# Patient Record
Sex: Female | Born: 1935 | Race: White | Hispanic: No | Smoking: Never smoker
Health system: Southern US, Community
[De-identification: ages and names within clinical notes are randomized; demographics above are authoritative.]

## PROBLEM LIST (undated history)

## (undated) DIAGNOSIS — E119 Type 2 diabetes mellitus without complications: Secondary | ICD-10-CM

## (undated) DIAGNOSIS — I10 Essential (primary) hypertension: Secondary | ICD-10-CM

## (undated) DIAGNOSIS — I509 Heart failure, unspecified: Secondary | ICD-10-CM

## (undated) DIAGNOSIS — I2510 Atherosclerotic heart disease of native coronary artery without angina pectoris: Secondary | ICD-10-CM

## (undated) DIAGNOSIS — E785 Hyperlipidemia, unspecified: Secondary | ICD-10-CM

## (undated) DIAGNOSIS — N189 Chronic kidney disease, unspecified: Secondary | ICD-10-CM

## (undated) HISTORY — DX: Essential (primary) hypertension: I10

## (undated) HISTORY — DX: Chronic kidney disease, unspecified: N18.9

## (undated) HISTORY — PX: TONSILLECTOMY: SUR1361

## (undated) HISTORY — PX: HYSTEROTOMY: SHX1776

## (undated) HISTORY — DX: Hyperlipidemia, unspecified: E78.5

## (undated) HISTORY — PX: APPENDECTOMY: SHX54

## (undated) HISTORY — PX: EYE SURGERY: SHX253

---
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
## 1997-08-08 ENCOUNTER — Other Ambulatory Visit: Admission: RE | Admit: 1997-08-08 | Discharge: 1997-08-08 | Payer: Self-pay | Admitting: Family Medicine

## 1997-08-12 ENCOUNTER — Other Ambulatory Visit: Admission: RE | Admit: 1997-08-12 | Discharge: 1997-08-12 | Payer: Self-pay | Admitting: Family Medicine

## 1997-08-29 ENCOUNTER — Ambulatory Visit (HOSPITAL_COMMUNITY): Admission: RE | Admit: 1997-08-29 | Discharge: 1997-08-29 | Payer: Self-pay | Admitting: Family Medicine

## 1998-02-25 ENCOUNTER — Ambulatory Visit (HOSPITAL_COMMUNITY): Admission: RE | Admit: 1998-02-25 | Discharge: 1998-02-25 | Payer: Self-pay | Admitting: Specialist

## 1998-08-26 ENCOUNTER — Other Ambulatory Visit: Admission: RE | Admit: 1998-08-26 | Discharge: 1998-08-26 | Payer: Self-pay | Admitting: Family Medicine

## 1998-09-12 ENCOUNTER — Ambulatory Visit (HOSPITAL_COMMUNITY): Admission: RE | Admit: 1998-09-12 | Discharge: 1998-09-12 | Payer: Self-pay | Admitting: Family Medicine

## 1999-08-27 ENCOUNTER — Other Ambulatory Visit: Admission: RE | Admit: 1999-08-27 | Discharge: 1999-08-27 | Payer: Self-pay | Admitting: Family Medicine

## 1999-10-02 ENCOUNTER — Ambulatory Visit (HOSPITAL_COMMUNITY): Admission: RE | Admit: 1999-10-02 | Discharge: 1999-10-02 | Payer: Self-pay | Admitting: Family Medicine

## 2000-09-09 ENCOUNTER — Other Ambulatory Visit: Admission: RE | Admit: 2000-09-09 | Discharge: 2000-09-09 | Payer: Self-pay | Admitting: Family Medicine

## 2000-10-11 ENCOUNTER — Encounter: Payer: Self-pay | Admitting: Family Medicine

## 2000-10-11 ENCOUNTER — Ambulatory Visit (HOSPITAL_COMMUNITY): Admission: RE | Admit: 2000-10-11 | Discharge: 2000-10-11 | Payer: Self-pay | Admitting: Family Medicine

## 2001-10-10 ENCOUNTER — Other Ambulatory Visit: Admission: RE | Admit: 2001-10-10 | Discharge: 2001-10-10 | Payer: Self-pay | Admitting: Family Medicine

## 2001-11-13 ENCOUNTER — Ambulatory Visit (HOSPITAL_COMMUNITY): Admission: RE | Admit: 2001-11-13 | Discharge: 2001-11-13 | Payer: Self-pay | Admitting: Family Medicine

## 2001-11-13 ENCOUNTER — Encounter: Payer: Self-pay | Admitting: Family Medicine

## 2002-11-22 ENCOUNTER — Encounter (INDEPENDENT_AMBULATORY_CARE_PROVIDER_SITE_OTHER): Payer: Self-pay | Admitting: Specialist

## 2002-11-22 ENCOUNTER — Ambulatory Visit (HOSPITAL_COMMUNITY): Admission: RE | Admit: 2002-11-22 | Discharge: 2002-11-22 | Payer: Self-pay | Admitting: Internal Medicine

## 2002-11-22 ENCOUNTER — Encounter: Payer: Self-pay | Admitting: Internal Medicine

## 2002-12-10 ENCOUNTER — Ambulatory Visit (HOSPITAL_COMMUNITY): Admission: RE | Admit: 2002-12-10 | Discharge: 2002-12-10 | Payer: Self-pay | Admitting: Obstetrics & Gynecology

## 2003-12-11 ENCOUNTER — Ambulatory Visit (HOSPITAL_COMMUNITY): Admission: RE | Admit: 2003-12-11 | Discharge: 2003-12-11 | Payer: Self-pay | Admitting: Family Medicine

## 2004-04-16 ENCOUNTER — Encounter: Admission: RE | Admit: 2004-04-16 | Discharge: 2004-04-16 | Payer: Self-pay | Admitting: Family Medicine

## 2004-12-04 ENCOUNTER — Encounter: Admission: RE | Admit: 2004-12-04 | Discharge: 2004-12-04 | Payer: Self-pay | Admitting: Family Medicine

## 2005-01-06 ENCOUNTER — Ambulatory Visit (HOSPITAL_COMMUNITY): Admission: RE | Admit: 2005-01-06 | Discharge: 2005-01-06 | Payer: Self-pay | Admitting: Family Medicine

## 2006-01-07 ENCOUNTER — Ambulatory Visit (HOSPITAL_COMMUNITY): Admission: RE | Admit: 2006-01-07 | Discharge: 2006-01-07 | Payer: Self-pay | Admitting: Family Medicine

## 2007-02-14 ENCOUNTER — Ambulatory Visit (HOSPITAL_COMMUNITY): Admission: RE | Admit: 2007-02-14 | Discharge: 2007-02-14 | Payer: Self-pay | Admitting: Family Medicine

## 2008-02-16 ENCOUNTER — Ambulatory Visit (HOSPITAL_COMMUNITY): Admission: RE | Admit: 2008-02-16 | Discharge: 2008-02-16 | Payer: Self-pay | Admitting: Family Medicine

## 2009-03-12 ENCOUNTER — Ambulatory Visit (HOSPITAL_COMMUNITY): Admission: RE | Admit: 2009-03-12 | Discharge: 2009-03-12 | Payer: Self-pay | Admitting: Internal Medicine

## 2009-03-28 ENCOUNTER — Telehealth: Payer: Self-pay | Admitting: Internal Medicine

## 2010-06-11 NOTE — Procedures (Signed)
Summary: COLONOSCOPY    Patient Name: Tracy Donaldson, Tracy Donaldson MRN: [id number] Procedure Procedures: Colonoscopy CPT: H7044205.    with biopsy. CPT: L3157292.  Personnel: Endoscopist: Gatha Mayer, MD, [facility].  Referred By: Dorene Grebe Juventino Slovak, MD.  Exam Location: Exam performed in Endoscopy Suite. Outpatient  Patient Consent: Procedure, Alternatives, Risks and Benefits discussed, consent obtained,  Indications  Evaluation of: Anemia  Symptoms: Diarrhea  Average Risk Screening Routine.  History  Pre-Exam Physical: Performed Nov 22, 2002. Cardio-pulmonary exam, Rectal exam, HEENT exam , Abdominal exam, Mental status exam WNL.  Exam Exam: Extent of exam reached: Cecum, extent intended: Cecum.  The cecum was identified by appendiceal orifice and IC valve. Patient position: left side to back. Colon retroflexion performed. Images taken. ASA Classification: II. Tolerance: good.  Monitoring: Pulse and BP monitoring, Oximetry used. Supplemental O2 given.  Colon Prep Used MiraLax for colon prep. Prep results: excellent.  Fluoroscopy: Fluoroscopy was not used.  Sedation Meds: Patient assessed and found to be appropriate for moderate (conscious) sedation. Sedation was managed by the Endoscopist. Fentanyl 100 mcg. given IV. Versed 7 mg. given IV.  Findings - NORMAL EXAM: Cecum to Sigmoid Colon. Biopsy/Normal Exam taken.  HEMORRHOIDS: External. Size: Grade I. Not bleeding. Not thrombosed. ICD9: Hemorrhoids, External: 455.3.   Assessment Abnormal examination, see findings above.  Diagnoses: 455.3: Hemorrhoids, External.   Comments: MILD HEMORRHOIDS, OTHERWISE NORMAL. RANDOM BIOPSIES TAKEN TO EVALUATE DIARRHEA Events  Unplanned Interventions: No intervention was required.  Plans Medication Plan: Await pathology.  Comments: IMODIUM AS NEEDED FOR NOW Disposition: After procedure patient sent to recovery. After recovery patient sent home.  Comments: I WILL CALL RESULTS AND PLANS. IF  BIOPSIES OK WILL ATTRIBUTE DIARRHEA TO GLUCOPHAGE.   CC: James B.Kindle,MD        The Patient   This report was created from the original endoscopy report, which was reviewed and signed by the above listed endoscopist.

## 2010-06-11 NOTE — Progress Notes (Signed)
Summary: ? COLON RECALL  Phone Note Call from Patient Call back at Home Phone [phone]   Caller: Patient Call For: DR.GESSNER Reason for Call: Talk to Nurse Summary of Call: pt getting established with a new PCP and he would like to know when pt is due for next COL accordingto Dr. Carlean Purl... nothing in IDX... pt's last COL was 2004... pt not reporting any GI concerns at this time. Initial call taken by: Lucien Mons,  March 28, 2009 10:31 AM  Follow-up for Phone Call        DR.GESSNER PLEASE ADVISE.  Follow-up by: Vivia Ewing LPN,  November 19, 624THL 10:42 AM  Additional Follow-up for Phone Call Additional follow up Details #1::        She could ask about having one 11/2012 but would be greater than 80 and I would not put in routine recall. If having GI problems in future like bleeding, anemia then could need diagnostic eval.  Additional Follow-up by: Gatha Mayer MD, Marval Regal,  March 28, 2009 11:26 AM    Additional Follow-up for Phone Call Additional follow up Details #2::    Above MD orders reviewed with patient. Pt. instructed to call back as needed.  Follow-up by: Vivia Ewing LPN,  November 19, 624THL 11:56 AM

## 2010-10-20 ENCOUNTER — Other Ambulatory Visit: Payer: Self-pay | Admitting: Dermatology

## 2011-02-12 ENCOUNTER — Other Ambulatory Visit (HOSPITAL_COMMUNITY): Payer: Self-pay | Admitting: Internal Medicine

## 2011-02-12 DIAGNOSIS — Z1231 Encounter for screening mammogram for malignant neoplasm of breast: Secondary | ICD-10-CM

## 2011-02-19 ENCOUNTER — Ambulatory Visit (HOSPITAL_COMMUNITY)
Admission: RE | Admit: 2011-02-19 | Discharge: 2011-02-19 | Disposition: A | Discharge status: Home / Self Care | Payer: Medicare Other | Source: Ambulatory Visit | Attending: Internal Medicine | Admitting: Internal Medicine

## 2011-02-19 DIAGNOSIS — Z1231 Encounter for screening mammogram for malignant neoplasm of breast: Secondary | ICD-10-CM | POA: Insufficient documentation

## 2011-05-31 DIAGNOSIS — H9210 Otorrhea, unspecified ear: Secondary | ICD-10-CM | POA: Diagnosis not present

## 2011-06-03 DIAGNOSIS — E1129 Type 2 diabetes mellitus with other diabetic kidney complication: Secondary | ICD-10-CM | POA: Diagnosis not present

## 2011-06-03 DIAGNOSIS — E559 Vitamin D deficiency, unspecified: Secondary | ICD-10-CM | POA: Diagnosis not present

## 2011-06-03 DIAGNOSIS — I10 Essential (primary) hypertension: Secondary | ICD-10-CM | POA: Diagnosis not present

## 2011-06-10 DIAGNOSIS — N184 Chronic kidney disease, stage 4 (severe): Secondary | ICD-10-CM | POA: Diagnosis not present

## 2011-06-10 DIAGNOSIS — I10 Essential (primary) hypertension: Secondary | ICD-10-CM | POA: Diagnosis not present

## 2011-06-10 DIAGNOSIS — E1165 Type 2 diabetes mellitus with hyperglycemia: Secondary | ICD-10-CM | POA: Diagnosis not present

## 2011-06-10 DIAGNOSIS — E1129 Type 2 diabetes mellitus with other diabetic kidney complication: Secondary | ICD-10-CM | POA: Diagnosis not present

## 2011-06-10 DIAGNOSIS — E785 Hyperlipidemia, unspecified: Secondary | ICD-10-CM | POA: Diagnosis not present

## 2011-06-15 DIAGNOSIS — H9210 Otorrhea, unspecified ear: Secondary | ICD-10-CM | POA: Diagnosis not present

## 2011-06-15 DIAGNOSIS — L299 Pruritus, unspecified: Secondary | ICD-10-CM | POA: Diagnosis not present

## 2011-06-16 DIAGNOSIS — E1139 Type 2 diabetes mellitus with other diabetic ophthalmic complication: Secondary | ICD-10-CM | POA: Diagnosis not present

## 2011-06-16 DIAGNOSIS — E11349 Type 2 diabetes mellitus with severe nonproliferative diabetic retinopathy without macular edema: Secondary | ICD-10-CM | POA: Diagnosis not present

## 2011-06-16 DIAGNOSIS — H35379 Puckering of macula, unspecified eye: Secondary | ICD-10-CM | POA: Diagnosis not present

## 2011-06-16 DIAGNOSIS — H35359 Cystoid macular degeneration, unspecified eye: Secondary | ICD-10-CM | POA: Diagnosis not present

## 2011-08-10 DIAGNOSIS — Z961 Presence of intraocular lens: Secondary | ICD-10-CM | POA: Diagnosis not present

## 2011-08-10 DIAGNOSIS — H524 Presbyopia: Secondary | ICD-10-CM | POA: Diagnosis not present

## 2011-08-10 DIAGNOSIS — E1139 Type 2 diabetes mellitus with other diabetic ophthalmic complication: Secondary | ICD-10-CM | POA: Diagnosis not present

## 2011-08-10 DIAGNOSIS — H5200 Hypermetropia, unspecified eye: Secondary | ICD-10-CM | POA: Diagnosis not present

## 2011-08-30 DIAGNOSIS — E1129 Type 2 diabetes mellitus with other diabetic kidney complication: Secondary | ICD-10-CM | POA: Diagnosis not present

## 2011-08-30 DIAGNOSIS — E11319 Type 2 diabetes mellitus with unspecified diabetic retinopathy without macular edema: Secondary | ICD-10-CM | POA: Diagnosis not present

## 2011-08-30 DIAGNOSIS — E1165 Type 2 diabetes mellitus with hyperglycemia: Secondary | ICD-10-CM | POA: Diagnosis not present

## 2011-09-06 DIAGNOSIS — N184 Chronic kidney disease, stage 4 (severe): Secondary | ICD-10-CM | POA: Diagnosis not present

## 2011-09-06 DIAGNOSIS — E785 Hyperlipidemia, unspecified: Secondary | ICD-10-CM | POA: Diagnosis not present

## 2011-09-06 DIAGNOSIS — I10 Essential (primary) hypertension: Secondary | ICD-10-CM | POA: Diagnosis not present

## 2011-09-06 DIAGNOSIS — E1129 Type 2 diabetes mellitus with other diabetic kidney complication: Secondary | ICD-10-CM | POA: Diagnosis not present

## 2011-10-26 DIAGNOSIS — E11349 Type 2 diabetes mellitus with severe nonproliferative diabetic retinopathy without macular edema: Secondary | ICD-10-CM | POA: Diagnosis not present

## 2011-10-26 DIAGNOSIS — E1139 Type 2 diabetes mellitus with other diabetic ophthalmic complication: Secondary | ICD-10-CM | POA: Diagnosis not present

## 2011-10-26 DIAGNOSIS — E11359 Type 2 diabetes mellitus with proliferative diabetic retinopathy without macular edema: Secondary | ICD-10-CM | POA: Diagnosis not present

## 2011-10-26 DIAGNOSIS — H35359 Cystoid macular degeneration, unspecified eye: Secondary | ICD-10-CM | POA: Diagnosis not present

## 2011-11-03 DIAGNOSIS — E1139 Type 2 diabetes mellitus with other diabetic ophthalmic complication: Secondary | ICD-10-CM | POA: Diagnosis not present

## 2011-11-03 DIAGNOSIS — E11359 Type 2 diabetes mellitus with proliferative diabetic retinopathy without macular edema: Secondary | ICD-10-CM | POA: Diagnosis not present

## 2011-12-08 DIAGNOSIS — E785 Hyperlipidemia, unspecified: Secondary | ICD-10-CM | POA: Diagnosis not present

## 2011-12-08 DIAGNOSIS — E1129 Type 2 diabetes mellitus with other diabetic kidney complication: Secondary | ICD-10-CM | POA: Diagnosis not present

## 2011-12-08 DIAGNOSIS — I10 Essential (primary) hypertension: Secondary | ICD-10-CM | POA: Diagnosis not present

## 2011-12-08 DIAGNOSIS — E559 Vitamin D deficiency, unspecified: Secondary | ICD-10-CM | POA: Diagnosis not present

## 2011-12-08 DIAGNOSIS — Z0000 Encounter for general adult medical examination without abnormal findings: Secondary | ICD-10-CM | POA: Diagnosis not present

## 2011-12-20 DIAGNOSIS — I10 Essential (primary) hypertension: Secondary | ICD-10-CM | POA: Diagnosis not present

## 2011-12-20 DIAGNOSIS — E785 Hyperlipidemia, unspecified: Secondary | ICD-10-CM | POA: Diagnosis not present

## 2011-12-20 DIAGNOSIS — E1129 Type 2 diabetes mellitus with other diabetic kidney complication: Secondary | ICD-10-CM | POA: Diagnosis not present

## 2011-12-20 DIAGNOSIS — N184 Chronic kidney disease, stage 4 (severe): Secondary | ICD-10-CM | POA: Diagnosis not present

## 2012-02-11 ENCOUNTER — Other Ambulatory Visit: Payer: Self-pay | Admitting: Dermatology

## 2012-02-11 DIAGNOSIS — Z85828 Personal history of other malignant neoplasm of skin: Secondary | ICD-10-CM | POA: Diagnosis not present

## 2012-02-11 DIAGNOSIS — L821 Other seborrheic keratosis: Secondary | ICD-10-CM | POA: Diagnosis not present

## 2012-02-11 DIAGNOSIS — I789 Disease of capillaries, unspecified: Secondary | ICD-10-CM | POA: Diagnosis not present

## 2012-02-11 DIAGNOSIS — D485 Neoplasm of uncertain behavior of skin: Secondary | ICD-10-CM | POA: Diagnosis not present

## 2012-02-11 DIAGNOSIS — D2370 Other benign neoplasm of skin of unspecified lower limb, including hip: Secondary | ICD-10-CM | POA: Diagnosis not present

## 2012-03-21 DIAGNOSIS — N184 Chronic kidney disease, stage 4 (severe): Secondary | ICD-10-CM | POA: Diagnosis not present

## 2012-03-21 DIAGNOSIS — E1129 Type 2 diabetes mellitus with other diabetic kidney complication: Secondary | ICD-10-CM | POA: Diagnosis not present

## 2012-03-21 DIAGNOSIS — Z23 Encounter for immunization: Secondary | ICD-10-CM | POA: Diagnosis not present

## 2012-03-21 DIAGNOSIS — I10 Essential (primary) hypertension: Secondary | ICD-10-CM | POA: Diagnosis not present

## 2012-03-21 DIAGNOSIS — E785 Hyperlipidemia, unspecified: Secondary | ICD-10-CM | POA: Diagnosis not present

## 2012-03-27 DIAGNOSIS — H35359 Cystoid macular degeneration, unspecified eye: Secondary | ICD-10-CM | POA: Diagnosis not present

## 2012-03-27 DIAGNOSIS — E11359 Type 2 diabetes mellitus with proliferative diabetic retinopathy without macular edema: Secondary | ICD-10-CM | POA: Diagnosis not present

## 2012-03-27 DIAGNOSIS — E1139 Type 2 diabetes mellitus with other diabetic ophthalmic complication: Secondary | ICD-10-CM | POA: Diagnosis not present

## 2012-03-27 DIAGNOSIS — E11349 Type 2 diabetes mellitus with severe nonproliferative diabetic retinopathy without macular edema: Secondary | ICD-10-CM | POA: Diagnosis not present

## 2012-06-21 DIAGNOSIS — E1129 Type 2 diabetes mellitus with other diabetic kidney complication: Secondary | ICD-10-CM | POA: Diagnosis not present

## 2012-06-21 DIAGNOSIS — E559 Vitamin D deficiency, unspecified: Secondary | ICD-10-CM | POA: Diagnosis not present

## 2012-06-21 DIAGNOSIS — E785 Hyperlipidemia, unspecified: Secondary | ICD-10-CM | POA: Diagnosis not present

## 2012-06-21 DIAGNOSIS — I10 Essential (primary) hypertension: Secondary | ICD-10-CM | POA: Diagnosis not present

## 2012-06-28 DIAGNOSIS — E1129 Type 2 diabetes mellitus with other diabetic kidney complication: Secondary | ICD-10-CM | POA: Diagnosis not present

## 2012-06-28 DIAGNOSIS — E785 Hyperlipidemia, unspecified: Secondary | ICD-10-CM | POA: Diagnosis not present

## 2012-06-28 DIAGNOSIS — N184 Chronic kidney disease, stage 4 (severe): Secondary | ICD-10-CM | POA: Diagnosis not present

## 2012-06-28 DIAGNOSIS — I10 Essential (primary) hypertension: Secondary | ICD-10-CM | POA: Diagnosis not present

## 2012-08-10 DIAGNOSIS — E11311 Type 2 diabetes mellitus with unspecified diabetic retinopathy with macular edema: Secondary | ICD-10-CM | POA: Diagnosis not present

## 2012-08-10 DIAGNOSIS — E11349 Type 2 diabetes mellitus with severe nonproliferative diabetic retinopathy without macular edema: Secondary | ICD-10-CM | POA: Diagnosis not present

## 2012-08-10 DIAGNOSIS — E1139 Type 2 diabetes mellitus with other diabetic ophthalmic complication: Secondary | ICD-10-CM | POA: Diagnosis not present

## 2012-09-26 DIAGNOSIS — H5200 Hypermetropia, unspecified eye: Secondary | ICD-10-CM | POA: Diagnosis not present

## 2012-09-26 DIAGNOSIS — E1139 Type 2 diabetes mellitus with other diabetic ophthalmic complication: Secondary | ICD-10-CM | POA: Diagnosis not present

## 2012-09-26 DIAGNOSIS — H01009 Unspecified blepharitis unspecified eye, unspecified eyelid: Secondary | ICD-10-CM | POA: Diagnosis not present

## 2012-09-26 DIAGNOSIS — H3500 Unspecified background retinopathy: Secondary | ICD-10-CM | POA: Diagnosis not present

## 2012-09-27 DIAGNOSIS — E1129 Type 2 diabetes mellitus with other diabetic kidney complication: Secondary | ICD-10-CM | POA: Diagnosis not present

## 2012-09-27 DIAGNOSIS — E1165 Type 2 diabetes mellitus with hyperglycemia: Secondary | ICD-10-CM | POA: Diagnosis not present

## 2012-10-03 DIAGNOSIS — I10 Essential (primary) hypertension: Secondary | ICD-10-CM | POA: Diagnosis not present

## 2012-10-03 DIAGNOSIS — E785 Hyperlipidemia, unspecified: Secondary | ICD-10-CM | POA: Diagnosis not present

## 2012-10-03 DIAGNOSIS — E1129 Type 2 diabetes mellitus with other diabetic kidney complication: Secondary | ICD-10-CM | POA: Diagnosis not present

## 2012-10-03 DIAGNOSIS — N184 Chronic kidney disease, stage 4 (severe): Secondary | ICD-10-CM | POA: Diagnosis not present

## 2012-12-26 DIAGNOSIS — E1139 Type 2 diabetes mellitus with other diabetic ophthalmic complication: Secondary | ICD-10-CM | POA: Diagnosis not present

## 2012-12-26 DIAGNOSIS — E11349 Type 2 diabetes mellitus with severe nonproliferative diabetic retinopathy without macular edema: Secondary | ICD-10-CM | POA: Diagnosis not present

## 2012-12-28 DIAGNOSIS — Z1331 Encounter for screening for depression: Secondary | ICD-10-CM | POA: Diagnosis not present

## 2012-12-28 DIAGNOSIS — Z0000 Encounter for general adult medical examination without abnormal findings: Secondary | ICD-10-CM | POA: Diagnosis not present

## 2012-12-28 DIAGNOSIS — I10 Essential (primary) hypertension: Secondary | ICD-10-CM | POA: Diagnosis not present

## 2012-12-28 DIAGNOSIS — E559 Vitamin D deficiency, unspecified: Secondary | ICD-10-CM | POA: Diagnosis not present

## 2012-12-28 DIAGNOSIS — E1129 Type 2 diabetes mellitus with other diabetic kidney complication: Secondary | ICD-10-CM | POA: Diagnosis not present

## 2013-01-01 ENCOUNTER — Other Ambulatory Visit (HOSPITAL_COMMUNITY): Payer: Self-pay | Admitting: Internal Medicine

## 2013-01-01 DIAGNOSIS — Z1231 Encounter for screening mammogram for malignant neoplasm of breast: Secondary | ICD-10-CM

## 2013-01-04 DIAGNOSIS — N184 Chronic kidney disease, stage 4 (severe): Secondary | ICD-10-CM | POA: Diagnosis not present

## 2013-01-04 DIAGNOSIS — J309 Allergic rhinitis, unspecified: Secondary | ICD-10-CM | POA: Diagnosis not present

## 2013-01-04 DIAGNOSIS — I10 Essential (primary) hypertension: Secondary | ICD-10-CM | POA: Diagnosis not present

## 2013-01-04 DIAGNOSIS — E785 Hyperlipidemia, unspecified: Secondary | ICD-10-CM | POA: Diagnosis not present

## 2013-01-04 DIAGNOSIS — E1129 Type 2 diabetes mellitus with other diabetic kidney complication: Secondary | ICD-10-CM | POA: Diagnosis not present

## 2013-01-15 ENCOUNTER — Ambulatory Visit (HOSPITAL_COMMUNITY)
Admission: RE | Admit: 2013-01-15 | Discharge: 2013-01-15 | Disposition: A | Discharge status: Home / Self Care | Payer: Medicare Other | Source: Ambulatory Visit | Attending: Internal Medicine | Admitting: Internal Medicine

## 2013-01-15 DIAGNOSIS — Z1231 Encounter for screening mammogram for malignant neoplasm of breast: Secondary | ICD-10-CM | POA: Insufficient documentation

## 2013-02-07 DIAGNOSIS — T169XXA Foreign body in ear, unspecified ear, initial encounter: Secondary | ICD-10-CM | POA: Diagnosis not present

## 2013-02-09 DIAGNOSIS — H93299 Other abnormal auditory perceptions, unspecified ear: Secondary | ICD-10-CM | POA: Diagnosis not present

## 2013-02-20 DIAGNOSIS — H66009 Acute suppurative otitis media without spontaneous rupture of ear drum, unspecified ear: Secondary | ICD-10-CM | POA: Diagnosis not present

## 2013-02-27 DIAGNOSIS — H908 Mixed conductive and sensorineural hearing loss, unspecified: Secondary | ICD-10-CM | POA: Diagnosis not present

## 2013-02-27 DIAGNOSIS — H902 Conductive hearing loss, unspecified: Secondary | ICD-10-CM | POA: Diagnosis not present

## 2013-02-27 DIAGNOSIS — H7290 Unspecified perforation of tympanic membrane, unspecified ear: Secondary | ICD-10-CM | POA: Diagnosis not present

## 2013-02-27 DIAGNOSIS — H905 Unspecified sensorineural hearing loss: Secondary | ICD-10-CM | POA: Diagnosis not present

## 2013-02-28 DIAGNOSIS — Z23 Encounter for immunization: Secondary | ICD-10-CM | POA: Diagnosis not present

## 2013-03-26 DIAGNOSIS — I10 Essential (primary) hypertension: Secondary | ICD-10-CM | POA: Diagnosis not present

## 2013-03-26 DIAGNOSIS — E1129 Type 2 diabetes mellitus with other diabetic kidney complication: Secondary | ICD-10-CM | POA: Diagnosis not present

## 2013-04-02 DIAGNOSIS — E1129 Type 2 diabetes mellitus with other diabetic kidney complication: Secondary | ICD-10-CM | POA: Diagnosis not present

## 2013-04-02 DIAGNOSIS — I10 Essential (primary) hypertension: Secondary | ICD-10-CM | POA: Diagnosis not present

## 2013-04-02 DIAGNOSIS — E785 Hyperlipidemia, unspecified: Secondary | ICD-10-CM | POA: Diagnosis not present

## 2013-04-02 DIAGNOSIS — N184 Chronic kidney disease, stage 4 (severe): Secondary | ICD-10-CM | POA: Diagnosis not present

## 2013-04-03 DIAGNOSIS — H7290 Unspecified perforation of tympanic membrane, unspecified ear: Secondary | ICD-10-CM | POA: Diagnosis not present

## 2013-04-24 DIAGNOSIS — H35379 Puckering of macula, unspecified eye: Secondary | ICD-10-CM | POA: Diagnosis not present

## 2013-04-24 DIAGNOSIS — E11349 Type 2 diabetes mellitus with severe nonproliferative diabetic retinopathy without macular edema: Secondary | ICD-10-CM | POA: Diagnosis not present

## 2013-04-24 DIAGNOSIS — E11359 Type 2 diabetes mellitus with proliferative diabetic retinopathy without macular edema: Secondary | ICD-10-CM | POA: Diagnosis not present

## 2013-04-24 DIAGNOSIS — E1139 Type 2 diabetes mellitus with other diabetic ophthalmic complication: Secondary | ICD-10-CM | POA: Diagnosis not present

## 2013-04-24 DIAGNOSIS — H35369 Drusen (degenerative) of macula, unspecified eye: Secondary | ICD-10-CM | POA: Diagnosis not present

## 2013-07-10 DIAGNOSIS — E1129 Type 2 diabetes mellitus with other diabetic kidney complication: Secondary | ICD-10-CM | POA: Diagnosis not present

## 2013-07-10 DIAGNOSIS — E559 Vitamin D deficiency, unspecified: Secondary | ICD-10-CM | POA: Diagnosis not present

## 2013-07-10 DIAGNOSIS — I10 Essential (primary) hypertension: Secondary | ICD-10-CM | POA: Diagnosis not present

## 2013-07-19 DIAGNOSIS — E785 Hyperlipidemia, unspecified: Secondary | ICD-10-CM | POA: Diagnosis not present

## 2013-07-19 DIAGNOSIS — I10 Essential (primary) hypertension: Secondary | ICD-10-CM | POA: Diagnosis not present

## 2013-07-19 DIAGNOSIS — N184 Chronic kidney disease, stage 4 (severe): Secondary | ICD-10-CM | POA: Diagnosis not present

## 2013-07-19 DIAGNOSIS — E1129 Type 2 diabetes mellitus with other diabetic kidney complication: Secondary | ICD-10-CM | POA: Diagnosis not present

## 2013-07-20 ENCOUNTER — Other Ambulatory Visit: Payer: Self-pay | Admitting: Dermatology

## 2013-07-20 DIAGNOSIS — Z85828 Personal history of other malignant neoplasm of skin: Secondary | ICD-10-CM | POA: Diagnosis not present

## 2013-07-20 DIAGNOSIS — L821 Other seborrheic keratosis: Secondary | ICD-10-CM | POA: Diagnosis not present

## 2013-07-20 DIAGNOSIS — D485 Neoplasm of uncertain behavior of skin: Secondary | ICD-10-CM | POA: Diagnosis not present

## 2013-07-20 DIAGNOSIS — D0470 Carcinoma in situ of skin of unspecified lower limb, including hip: Secondary | ICD-10-CM | POA: Diagnosis not present

## 2013-07-20 DIAGNOSIS — C4370 Malignant melanoma of unspecified lower limb, including hip: Secondary | ICD-10-CM | POA: Diagnosis not present

## 2013-07-20 DIAGNOSIS — D1801 Hemangioma of skin and subcutaneous tissue: Secondary | ICD-10-CM | POA: Diagnosis not present

## 2013-07-31 ENCOUNTER — Other Ambulatory Visit: Payer: Self-pay | Admitting: Dermatology

## 2013-07-31 DIAGNOSIS — C4370 Malignant melanoma of unspecified lower limb, including hip: Secondary | ICD-10-CM | POA: Diagnosis not present

## 2013-07-31 DIAGNOSIS — Z85828 Personal history of other malignant neoplasm of skin: Secondary | ICD-10-CM | POA: Diagnosis not present

## 2013-07-31 DIAGNOSIS — D485 Neoplasm of uncertain behavior of skin: Secondary | ICD-10-CM | POA: Diagnosis not present

## 2013-10-02 DIAGNOSIS — Z961 Presence of intraocular lens: Secondary | ICD-10-CM | POA: Diagnosis not present

## 2013-10-02 DIAGNOSIS — E1139 Type 2 diabetes mellitus with other diabetic ophthalmic complication: Secondary | ICD-10-CM | POA: Diagnosis not present

## 2013-10-02 DIAGNOSIS — H52209 Unspecified astigmatism, unspecified eye: Secondary | ICD-10-CM | POA: Diagnosis not present

## 2013-10-10 DIAGNOSIS — E1129 Type 2 diabetes mellitus with other diabetic kidney complication: Secondary | ICD-10-CM | POA: Diagnosis not present

## 2013-10-10 DIAGNOSIS — E785 Hyperlipidemia, unspecified: Secondary | ICD-10-CM | POA: Diagnosis not present

## 2013-10-18 DIAGNOSIS — N184 Chronic kidney disease, stage 4 (severe): Secondary | ICD-10-CM | POA: Diagnosis not present

## 2013-10-18 DIAGNOSIS — I10 Essential (primary) hypertension: Secondary | ICD-10-CM | POA: Diagnosis not present

## 2013-10-18 DIAGNOSIS — E785 Hyperlipidemia, unspecified: Secondary | ICD-10-CM | POA: Diagnosis not present

## 2013-10-18 DIAGNOSIS — E1129 Type 2 diabetes mellitus with other diabetic kidney complication: Secondary | ICD-10-CM | POA: Diagnosis not present

## 2013-10-23 DIAGNOSIS — E11359 Type 2 diabetes mellitus with proliferative diabetic retinopathy without macular edema: Secondary | ICD-10-CM | POA: Diagnosis not present

## 2013-10-23 DIAGNOSIS — E1139 Type 2 diabetes mellitus with other diabetic ophthalmic complication: Secondary | ICD-10-CM | POA: Diagnosis not present

## 2013-10-23 DIAGNOSIS — H35359 Cystoid macular degeneration, unspecified eye: Secondary | ICD-10-CM | POA: Diagnosis not present

## 2013-10-23 DIAGNOSIS — E11349 Type 2 diabetes mellitus with severe nonproliferative diabetic retinopathy without macular edema: Secondary | ICD-10-CM | POA: Diagnosis not present

## 2014-01-10 DIAGNOSIS — E559 Vitamin D deficiency, unspecified: Secondary | ICD-10-CM | POA: Diagnosis not present

## 2014-01-10 DIAGNOSIS — E1129 Type 2 diabetes mellitus with other diabetic kidney complication: Secondary | ICD-10-CM | POA: Diagnosis not present

## 2014-01-10 DIAGNOSIS — I10 Essential (primary) hypertension: Secondary | ICD-10-CM | POA: Diagnosis not present

## 2014-01-16 DIAGNOSIS — E1165 Type 2 diabetes mellitus with hyperglycemia: Secondary | ICD-10-CM | POA: Diagnosis not present

## 2014-01-16 DIAGNOSIS — E1129 Type 2 diabetes mellitus with other diabetic kidney complication: Secondary | ICD-10-CM | POA: Diagnosis not present

## 2014-01-16 DIAGNOSIS — E785 Hyperlipidemia, unspecified: Secondary | ICD-10-CM | POA: Diagnosis not present

## 2014-01-16 DIAGNOSIS — N184 Chronic kidney disease, stage 4 (severe): Secondary | ICD-10-CM | POA: Diagnosis not present

## 2014-01-16 DIAGNOSIS — I10 Essential (primary) hypertension: Secondary | ICD-10-CM | POA: Diagnosis not present

## 2014-01-16 DIAGNOSIS — E559 Vitamin D deficiency, unspecified: Secondary | ICD-10-CM | POA: Diagnosis not present

## 2014-02-07 DIAGNOSIS — L812 Freckles: Secondary | ICD-10-CM | POA: Diagnosis not present

## 2014-02-07 DIAGNOSIS — L821 Other seborrheic keratosis: Secondary | ICD-10-CM | POA: Diagnosis not present

## 2014-02-07 DIAGNOSIS — Z85828 Personal history of other malignant neoplasm of skin: Secondary | ICD-10-CM | POA: Diagnosis not present

## 2014-02-07 DIAGNOSIS — Z85820 Personal history of malignant melanoma of skin: Secondary | ICD-10-CM | POA: Diagnosis not present

## 2014-02-07 DIAGNOSIS — D1801 Hemangioma of skin and subcutaneous tissue: Secondary | ICD-10-CM | POA: Diagnosis not present

## 2014-02-19 DIAGNOSIS — Z23 Encounter for immunization: Secondary | ICD-10-CM | POA: Diagnosis not present

## 2014-04-11 DIAGNOSIS — E559 Vitamin D deficiency, unspecified: Secondary | ICD-10-CM | POA: Diagnosis not present

## 2014-04-11 DIAGNOSIS — I10 Essential (primary) hypertension: Secondary | ICD-10-CM | POA: Diagnosis not present

## 2014-04-11 DIAGNOSIS — E1129 Type 2 diabetes mellitus with other diabetic kidney complication: Secondary | ICD-10-CM | POA: Diagnosis not present

## 2014-04-18 DIAGNOSIS — E785 Hyperlipidemia, unspecified: Secondary | ICD-10-CM | POA: Diagnosis not present

## 2014-04-18 DIAGNOSIS — N184 Chronic kidney disease, stage 4 (severe): Secondary | ICD-10-CM | POA: Diagnosis not present

## 2014-04-18 DIAGNOSIS — I10 Essential (primary) hypertension: Secondary | ICD-10-CM | POA: Diagnosis not present

## 2014-04-18 DIAGNOSIS — E1121 Type 2 diabetes mellitus with diabetic nephropathy: Secondary | ICD-10-CM | POA: Diagnosis not present

## 2014-07-02 DIAGNOSIS — H35363 Drusen (degenerative) of macula, bilateral: Secondary | ICD-10-CM | POA: Diagnosis not present

## 2014-07-02 DIAGNOSIS — H35351 Cystoid macular degeneration, right eye: Secondary | ICD-10-CM | POA: Diagnosis not present

## 2014-07-02 DIAGNOSIS — E11349 Type 2 diabetes mellitus with severe nonproliferative diabetic retinopathy without macular edema: Secondary | ICD-10-CM | POA: Diagnosis not present

## 2014-07-02 DIAGNOSIS — E11359 Type 2 diabetes mellitus with proliferative diabetic retinopathy without macular edema: Secondary | ICD-10-CM | POA: Diagnosis not present

## 2014-07-11 DIAGNOSIS — E1121 Type 2 diabetes mellitus with diabetic nephropathy: Secondary | ICD-10-CM | POA: Diagnosis not present

## 2014-07-11 DIAGNOSIS — E559 Vitamin D deficiency, unspecified: Secondary | ICD-10-CM | POA: Diagnosis not present

## 2014-07-11 DIAGNOSIS — I10 Essential (primary) hypertension: Secondary | ICD-10-CM | POA: Diagnosis not present

## 2014-07-18 DIAGNOSIS — E1122 Type 2 diabetes mellitus with diabetic chronic kidney disease: Secondary | ICD-10-CM | POA: Diagnosis not present

## 2014-07-18 DIAGNOSIS — E1165 Type 2 diabetes mellitus with hyperglycemia: Secondary | ICD-10-CM | POA: Diagnosis not present

## 2014-07-18 DIAGNOSIS — E785 Hyperlipidemia, unspecified: Secondary | ICD-10-CM | POA: Diagnosis not present

## 2014-07-18 DIAGNOSIS — I10 Essential (primary) hypertension: Secondary | ICD-10-CM | POA: Diagnosis not present

## 2014-08-22 DIAGNOSIS — D1801 Hemangioma of skin and subcutaneous tissue: Secondary | ICD-10-CM | POA: Diagnosis not present

## 2014-08-22 DIAGNOSIS — L821 Other seborrheic keratosis: Secondary | ICD-10-CM | POA: Diagnosis not present

## 2014-08-22 DIAGNOSIS — Z85828 Personal history of other malignant neoplasm of skin: Secondary | ICD-10-CM | POA: Diagnosis not present

## 2014-08-22 DIAGNOSIS — Z85820 Personal history of malignant melanoma of skin: Secondary | ICD-10-CM | POA: Diagnosis not present

## 2014-08-22 DIAGNOSIS — L814 Other melanin hyperpigmentation: Secondary | ICD-10-CM | POA: Diagnosis not present

## 2014-10-08 DIAGNOSIS — Z961 Presence of intraocular lens: Secondary | ICD-10-CM | POA: Diagnosis not present

## 2014-10-08 DIAGNOSIS — E11339 Type 2 diabetes mellitus with moderate nonproliferative diabetic retinopathy without macular edema: Secondary | ICD-10-CM | POA: Diagnosis not present

## 2014-10-08 DIAGNOSIS — H5201 Hypermetropia, right eye: Secondary | ICD-10-CM | POA: Diagnosis not present

## 2014-10-10 DIAGNOSIS — E1122 Type 2 diabetes mellitus with diabetic chronic kidney disease: Secondary | ICD-10-CM | POA: Diagnosis not present

## 2014-10-10 DIAGNOSIS — E559 Vitamin D deficiency, unspecified: Secondary | ICD-10-CM | POA: Diagnosis not present

## 2014-10-10 DIAGNOSIS — I10 Essential (primary) hypertension: Secondary | ICD-10-CM | POA: Diagnosis not present

## 2014-10-10 DIAGNOSIS — E785 Hyperlipidemia, unspecified: Secondary | ICD-10-CM | POA: Diagnosis not present

## 2014-10-21 DIAGNOSIS — E785 Hyperlipidemia, unspecified: Secondary | ICD-10-CM | POA: Diagnosis not present

## 2014-10-21 DIAGNOSIS — E1140 Type 2 diabetes mellitus with diabetic neuropathy, unspecified: Secondary | ICD-10-CM | POA: Diagnosis not present

## 2014-10-21 DIAGNOSIS — I10 Essential (primary) hypertension: Secondary | ICD-10-CM | POA: Diagnosis not present

## 2014-10-21 DIAGNOSIS — E1122 Type 2 diabetes mellitus with diabetic chronic kidney disease: Secondary | ICD-10-CM | POA: Diagnosis not present

## 2015-01-27 DIAGNOSIS — E785 Hyperlipidemia, unspecified: Secondary | ICD-10-CM | POA: Diagnosis not present

## 2015-01-27 DIAGNOSIS — I10 Essential (primary) hypertension: Secondary | ICD-10-CM | POA: Diagnosis not present

## 2015-01-27 DIAGNOSIS — E1122 Type 2 diabetes mellitus with diabetic chronic kidney disease: Secondary | ICD-10-CM | POA: Diagnosis not present

## 2015-01-27 DIAGNOSIS — E559 Vitamin D deficiency, unspecified: Secondary | ICD-10-CM | POA: Diagnosis not present

## 2015-03-03 DIAGNOSIS — Z85820 Personal history of malignant melanoma of skin: Secondary | ICD-10-CM | POA: Diagnosis not present

## 2015-03-03 DIAGNOSIS — D1801 Hemangioma of skin and subcutaneous tissue: Secondary | ICD-10-CM | POA: Diagnosis not present

## 2015-03-03 DIAGNOSIS — L812 Freckles: Secondary | ICD-10-CM | POA: Diagnosis not present

## 2015-03-03 DIAGNOSIS — Z85828 Personal history of other malignant neoplasm of skin: Secondary | ICD-10-CM | POA: Diagnosis not present

## 2015-03-03 DIAGNOSIS — L820 Inflamed seborrheic keratosis: Secondary | ICD-10-CM | POA: Diagnosis not present

## 2015-03-03 DIAGNOSIS — L821 Other seborrheic keratosis: Secondary | ICD-10-CM | POA: Diagnosis not present

## 2015-03-10 DIAGNOSIS — Z23 Encounter for immunization: Secondary | ICD-10-CM | POA: Diagnosis not present

## 2015-04-09 DIAGNOSIS — H353131 Nonexudative age-related macular degeneration, bilateral, early dry stage: Secondary | ICD-10-CM | POA: Diagnosis not present

## 2015-04-09 DIAGNOSIS — H35363 Drusen (degenerative) of macula, bilateral: Secondary | ICD-10-CM | POA: Diagnosis not present

## 2015-04-09 DIAGNOSIS — H3562 Retinal hemorrhage, left eye: Secondary | ICD-10-CM | POA: Diagnosis not present

## 2015-04-09 DIAGNOSIS — E113493 Type 2 diabetes mellitus with severe nonproliferative diabetic retinopathy without macular edema, bilateral: Secondary | ICD-10-CM | POA: Diagnosis not present

## 2015-05-06 DIAGNOSIS — N184 Chronic kidney disease, stage 4 (severe): Secondary | ICD-10-CM | POA: Diagnosis not present

## 2015-05-06 DIAGNOSIS — E1122 Type 2 diabetes mellitus with diabetic chronic kidney disease: Secondary | ICD-10-CM | POA: Diagnosis not present

## 2015-05-06 DIAGNOSIS — I129 Hypertensive chronic kidney disease with stage 1 through stage 4 chronic kidney disease, or unspecified chronic kidney disease: Secondary | ICD-10-CM | POA: Diagnosis not present

## 2015-05-06 DIAGNOSIS — E785 Hyperlipidemia, unspecified: Secondary | ICD-10-CM | POA: Diagnosis not present

## 2015-05-06 DIAGNOSIS — R0989 Other specified symptoms and signs involving the circulatory and respiratory systems: Secondary | ICD-10-CM | POA: Diagnosis not present

## 2015-05-07 ENCOUNTER — Other Ambulatory Visit: Payer: Self-pay | Admitting: Internal Medicine

## 2015-05-07 DIAGNOSIS — R0989 Other specified symptoms and signs involving the circulatory and respiratory systems: Secondary | ICD-10-CM

## 2015-05-13 ENCOUNTER — Ambulatory Visit
Admission: RE | Admit: 2015-05-13 | Discharge: 2015-05-13 | Disposition: A | Discharge status: Home / Self Care | Payer: Medicare Other | Source: Ambulatory Visit | Attending: Internal Medicine | Admitting: Internal Medicine

## 2015-05-13 DIAGNOSIS — R0989 Other specified symptoms and signs involving the circulatory and respiratory systems: Secondary | ICD-10-CM | POA: Diagnosis not present

## 2015-08-24 DIAGNOSIS — S81812A Laceration without foreign body, left lower leg, initial encounter: Secondary | ICD-10-CM | POA: Diagnosis not present

## 2015-08-31 DIAGNOSIS — S81812D Laceration without foreign body, left lower leg, subsequent encounter: Secondary | ICD-10-CM | POA: Diagnosis not present

## 2015-09-01 DIAGNOSIS — D692 Other nonthrombocytopenic purpura: Secondary | ICD-10-CM | POA: Diagnosis not present

## 2015-09-01 DIAGNOSIS — L821 Other seborrheic keratosis: Secondary | ICD-10-CM | POA: Diagnosis not present

## 2015-09-01 DIAGNOSIS — L820 Inflamed seborrheic keratosis: Secondary | ICD-10-CM | POA: Diagnosis not present

## 2015-09-01 DIAGNOSIS — L239 Allergic contact dermatitis, unspecified cause: Secondary | ICD-10-CM | POA: Diagnosis not present

## 2015-09-01 DIAGNOSIS — L814 Other melanin hyperpigmentation: Secondary | ICD-10-CM | POA: Diagnosis not present

## 2015-09-01 DIAGNOSIS — Z85828 Personal history of other malignant neoplasm of skin: Secondary | ICD-10-CM | POA: Diagnosis not present

## 2015-09-01 DIAGNOSIS — L570 Actinic keratosis: Secondary | ICD-10-CM | POA: Diagnosis not present

## 2015-09-01 DIAGNOSIS — Z85820 Personal history of malignant melanoma of skin: Secondary | ICD-10-CM | POA: Diagnosis not present

## 2015-09-01 DIAGNOSIS — D1801 Hemangioma of skin and subcutaneous tissue: Secondary | ICD-10-CM | POA: Diagnosis not present

## 2015-10-13 DIAGNOSIS — E113393 Type 2 diabetes mellitus with moderate nonproliferative diabetic retinopathy without macular edema, bilateral: Secondary | ICD-10-CM | POA: Diagnosis not present

## 2015-10-13 DIAGNOSIS — H5203 Hypermetropia, bilateral: Secondary | ICD-10-CM | POA: Diagnosis not present

## 2015-10-28 DIAGNOSIS — I129 Hypertensive chronic kidney disease with stage 1 through stage 4 chronic kidney disease, or unspecified chronic kidney disease: Secondary | ICD-10-CM | POA: Diagnosis not present

## 2015-10-28 DIAGNOSIS — E559 Vitamin D deficiency, unspecified: Secondary | ICD-10-CM | POA: Diagnosis not present

## 2015-10-28 DIAGNOSIS — E1122 Type 2 diabetes mellitus with diabetic chronic kidney disease: Secondary | ICD-10-CM | POA: Diagnosis not present

## 2015-10-28 DIAGNOSIS — E785 Hyperlipidemia, unspecified: Secondary | ICD-10-CM | POA: Diagnosis not present

## 2015-11-04 DIAGNOSIS — N184 Chronic kidney disease, stage 4 (severe): Secondary | ICD-10-CM | POA: Diagnosis not present

## 2015-11-04 DIAGNOSIS — I77811 Abdominal aortic ectasia: Secondary | ICD-10-CM | POA: Diagnosis not present

## 2015-11-04 DIAGNOSIS — E1122 Type 2 diabetes mellitus with diabetic chronic kidney disease: Secondary | ICD-10-CM | POA: Diagnosis not present

## 2015-11-04 DIAGNOSIS — I129 Hypertensive chronic kidney disease with stage 1 through stage 4 chronic kidney disease, or unspecified chronic kidney disease: Secondary | ICD-10-CM | POA: Diagnosis not present

## 2016-01-09 ENCOUNTER — Other Ambulatory Visit: Payer: Self-pay

## 2016-01-14 DIAGNOSIS — H35363 Drusen (degenerative) of macula, bilateral: Secondary | ICD-10-CM | POA: Diagnosis not present

## 2016-01-14 DIAGNOSIS — E113552 Type 2 diabetes mellitus with stable proliferative diabetic retinopathy, left eye: Secondary | ICD-10-CM | POA: Diagnosis not present

## 2016-01-14 DIAGNOSIS — H353131 Nonexudative age-related macular degeneration, bilateral, early dry stage: Secondary | ICD-10-CM | POA: Diagnosis not present

## 2016-01-14 DIAGNOSIS — E113491 Type 2 diabetes mellitus with severe nonproliferative diabetic retinopathy without macular edema, right eye: Secondary | ICD-10-CM | POA: Diagnosis not present

## 2016-01-28 DIAGNOSIS — E785 Hyperlipidemia, unspecified: Secondary | ICD-10-CM | POA: Diagnosis not present

## 2016-01-28 DIAGNOSIS — I129 Hypertensive chronic kidney disease with stage 1 through stage 4 chronic kidney disease, or unspecified chronic kidney disease: Secondary | ICD-10-CM | POA: Diagnosis not present

## 2016-01-28 DIAGNOSIS — I10 Essential (primary) hypertension: Secondary | ICD-10-CM | POA: Diagnosis not present

## 2016-01-28 DIAGNOSIS — E559 Vitamin D deficiency, unspecified: Secondary | ICD-10-CM | POA: Diagnosis not present

## 2016-01-28 DIAGNOSIS — E1122 Type 2 diabetes mellitus with diabetic chronic kidney disease: Secondary | ICD-10-CM | POA: Diagnosis not present

## 2016-02-04 DIAGNOSIS — E1122 Type 2 diabetes mellitus with diabetic chronic kidney disease: Secondary | ICD-10-CM | POA: Diagnosis not present

## 2016-02-04 DIAGNOSIS — Z23 Encounter for immunization: Secondary | ICD-10-CM | POA: Diagnosis not present

## 2016-03-16 DIAGNOSIS — L308 Other specified dermatitis: Secondary | ICD-10-CM | POA: Diagnosis not present

## 2016-03-16 DIAGNOSIS — L812 Freckles: Secondary | ICD-10-CM | POA: Diagnosis not present

## 2016-03-16 DIAGNOSIS — Z85820 Personal history of malignant melanoma of skin: Secondary | ICD-10-CM | POA: Diagnosis not present

## 2016-03-16 DIAGNOSIS — D1801 Hemangioma of skin and subcutaneous tissue: Secondary | ICD-10-CM | POA: Diagnosis not present

## 2016-03-16 DIAGNOSIS — Z85828 Personal history of other malignant neoplasm of skin: Secondary | ICD-10-CM | POA: Diagnosis not present

## 2016-03-16 DIAGNOSIS — L821 Other seborrheic keratosis: Secondary | ICD-10-CM | POA: Diagnosis not present

## 2016-04-21 DIAGNOSIS — E1122 Type 2 diabetes mellitus with diabetic chronic kidney disease: Secondary | ICD-10-CM | POA: Diagnosis not present

## 2016-04-28 DIAGNOSIS — Z23 Encounter for immunization: Secondary | ICD-10-CM | POA: Diagnosis not present

## 2016-04-28 DIAGNOSIS — I129 Hypertensive chronic kidney disease with stage 1 through stage 4 chronic kidney disease, or unspecified chronic kidney disease: Secondary | ICD-10-CM | POA: Diagnosis not present

## 2016-04-28 DIAGNOSIS — E1122 Type 2 diabetes mellitus with diabetic chronic kidney disease: Secondary | ICD-10-CM | POA: Diagnosis not present

## 2016-04-28 DIAGNOSIS — E785 Hyperlipidemia, unspecified: Secondary | ICD-10-CM | POA: Diagnosis not present

## 2016-04-28 DIAGNOSIS — N184 Chronic kidney disease, stage 4 (severe): Secondary | ICD-10-CM | POA: Diagnosis not present

## 2018-07-11 ENCOUNTER — Other Ambulatory Visit: Payer: Self-pay | Admitting: Internal Medicine

## 2018-07-11 DIAGNOSIS — R1030 Lower abdominal pain, unspecified: Secondary | ICD-10-CM

## 2018-07-14 ENCOUNTER — Ambulatory Visit
Admission: RE | Admit: 2018-07-14 | Discharge: 2018-07-14 | Disposition: A | Discharge status: Home / Self Care | Payer: Medicare Other | Source: Ambulatory Visit | Attending: Internal Medicine | Admitting: Internal Medicine

## 2018-07-14 DIAGNOSIS — R1030 Lower abdominal pain, unspecified: Secondary | ICD-10-CM

## 2018-10-05 ENCOUNTER — Other Ambulatory Visit: Payer: Self-pay

## 2018-10-05 ENCOUNTER — Encounter: Payer: Self-pay | Admitting: Cardiology

## 2018-10-05 ENCOUNTER — Ambulatory Visit: Payer: Medicare Other | Admitting: Cardiology

## 2018-10-05 VITALS — BP 135/78 | HR 74 | Ht 61.5 in | Wt 123.4 lb

## 2018-10-05 DIAGNOSIS — I10 Essential (primary) hypertension: Secondary | ICD-10-CM

## 2018-10-05 DIAGNOSIS — I447 Left bundle-branch block, unspecified: Secondary | ICD-10-CM

## 2018-10-05 NOTE — Progress Notes (Signed)
Patient referred by Deland Pretty, MD for abnormal EKG  Subjective:   Tracy Donaldson, female    DOB: 02-27-1930, 85 y.o.   MRN: [id number]   Chief Complaint  Patient presents with  . Abnormal ECG  . New Patient (Initial Visit)     HPI  85 y.o. Caucasian female with controled hypertension, type 2 DM, referred for evaluation of abnormal EKG.  Patient is fairly independent and does need any help with ADLs. While she states that she does not have as much energy as she did in younger years, she denies any exertional chest pain or shortness of breath. She also denies orthopnea, PND, leg edema, presyncope pr syncope symptoms. Just in the last few days, she noticed pain in her left upper shoulder when she wakes up in the morning. Pain does not bother her when she walks.   Past Medical History:  Diagnosis Date  . Chronic kidney disease   . Diabetes mellitus without complication ([place])   . Hyperlipidemia   . Hypertension      Past Surgical History:  Procedure Laterality Date  . APPENDECTOMY    . CATARACT EXTRACTION    . HYSTEROTOMY    . TONSILLECTOMY       Social History   Socioeconomic History  . Marital status: Widowed    Spouse name: Not on file  . Number of children: Not on file  . Years of education: Not on file  . Highest education level: Not on file  Occupational History  . Not on file  Social Needs  . Financial resource strain: Not on file  . Food insecurity:    Worry: Not on file    Inability: Not on file  . Transportation needs:    Medical: Not on file    Non-medical: Not on file  Tobacco Use  . Smoking status: Not on file  Substance and Sexual Activity  . Alcohol use: Not on file  . Drug use: Not on file  . Sexual activity: Not on file  Lifestyle  . Physical activity:    Days per week: Not on file    Minutes per session: Not on file  . Stress: Not on file  Relationships  . Social connections:    Talks on phone: Not on file    Gets together: Not  on file    Attends religious service: Not on file    Active member of club or organization: Not on file    Attends meetings of clubs or organizations: Not on file    Relationship status: Not on file  . Intimate partner violence:    Fear of current or ex partner: Not on file    Emotionally abused: Not on file    Physically abused: Not on file    Forced sexual activity: Not on file  Other Topics Concern  . Not on file  Social History Narrative  . Not on file     Family History  Problem Relation Age of Onset  . Diabetes Mother   . Hypertension Mother   . Diabetes Father   . Hypertension Father   . Diabetes Sister   . Diabetes Brother      Current Outpatient Medications on File Prior to Visit  Medication Sig Dispense Refill  . amLODipine (NORVASC) 10 MG tablet Take 5 mg by mouth.     Marland Kitchen glipiZIDE (GLUCOTROL XL) 2.5 MG 24 hr tablet Take 5 mg by mouth daily.    Marland Kitchen glucose blood (  ONE TOUCH ULTRA TEST) test strip CHECK CBG ONCE A DAY IN VITRO 90 DAYS    . glucose blood test strip CHECK CBG ONCE A DAY IN VITRO 90    . losartan-hydrochlorothiazide (HYZAAR) 100-12.5 MG tablet Take 1 tablet by mouth daily.    . metoprolol succinate (TOPROL-XL) 50 MG 24 hr tablet Take 1 tablet by mouth daily.    . simvastatin (ZOCOR) 5 MG tablet Take 1 tablet by mouth daily.    . sitaGLIPtin (JANUVIA) 25 MG tablet Take 1 tablet by mouth daily.     No current facility-administered medications on file prior to visit.     Cardiovascular studies:  EKG 09/25/2018: Sinus rhythm 65 bpm. LBBB  Recent labs: 09/20/2018: Glucose 109. BUN/Cr 38/1.61. eGFR 24. Na/K 142/4.5. Rest of the CMP normal.  H/H 11.7/34.9. MCV 89. Platelets 248. HbA1C 7.0% Chol 120, TG 106, HDL 49, LDL 0.    Review of Systems  Constitution: Negative for decreased appetite, malaise/fatigue, weight gain and weight loss.  HENT: Negative for congestion.   Eyes: Negative for visual disturbance.  Cardiovascular: Negative for chest  pain, dyspnea on exertion, leg swelling, palpitations and syncope.  Respiratory: Negative for cough.   Endocrine: Negative for cold intolerance.  Hematologic/Lymphatic: Does not bruise/bleed easily.  Skin: Negative for itching and rash.  Musculoskeletal: Negative for myalgias.  Gastrointestinal: Negative for abdominal pain, nausea and vomiting.  Genitourinary: Negative for dysuria.  Neurological: Negative for dizziness and weakness.  Psychiatric/Behavioral: The patient is not nervous/anxious.   All other systems reviewed and are negative.        Vitals:   10/05/18 1310  BP: 135/78  Pulse: 74  SpO2: 98%     There is no height or weight on file to calculate BMI. Filed Weights   10/05/18 1310  Weight: 56 kg     Objective:   Physical Exam  Constitutional: She is oriented to person, place, and time. She appears well-developed and well-nourished. No distress.  HENT:  Head: Normocephalic and atraumatic.  Eyes: Pupils are equal, round, and reactive to light. Conjunctivae are normal.  Neck: No JVD present.  Cardiovascular: Normal rate, regular rhythm and intact distal pulses.  Pulmonary/Chest: Effort normal and breath sounds normal. She has no wheezes. She has no rales.  Abdominal: Soft. Bowel sounds are normal. There is no rebound.  Musculoskeletal:        General: No edema.  Lymphadenopathy:    She has no cervical adenopathy.  Neurological: She is alert and oriented to person, place, and time. No cranial nerve deficit.  Skin: Skin is warm and dry.  Psychiatric: She has a normal mood and affect.  Nursing note and vitals reviewed.         Assessment & Recommendations:   85 y.o. Caucasian female with controled hypertension, type 2 DM, referred for evaluation of abnormal EKG.  Abnormal EKG: LBBB seen. No heart failure on history and exam. Will obtain echocardiogram for baseline evaluation.  She does not have any angina symptoms. I do not think she warrants stress  test.  Hypertension: Well controled continue current antihypertensive therapy.   Type 2 DM: Continue management as per PCP.  I will see her on as needed basis.    Thank you for referring the patient to [place]. Please feel free to contact with any questions.  Nigel Mormon, MD [facility] Pager: [phone] Office: [phone] If no answer Cell [phone]

## 2018-10-11 ENCOUNTER — Ambulatory Visit (INDEPENDENT_AMBULATORY_CARE_PROVIDER_SITE_OTHER): Payer: Medicare Other

## 2018-10-11 ENCOUNTER — Other Ambulatory Visit: Payer: Self-pay

## 2018-10-11 DIAGNOSIS — I447 Left bundle-branch block, unspecified: Secondary | ICD-10-CM

## 2018-10-16 NOTE — Progress Notes (Signed)
Did you mean to send it to me. I get a few of your messages this way

## 2018-10-18 NOTE — Progress Notes (Signed)
Please see above from MP

## 2018-10-18 NOTE — Progress Notes (Signed)
Please see above note

## 2018-10-19 NOTE — Progress Notes (Signed)
Already spoke to pt on Tuesday

## 2019-07-03 ENCOUNTER — Ambulatory Visit: Payer: Medicare Other

## 2019-08-21 ENCOUNTER — Encounter (INDEPENDENT_AMBULATORY_CARE_PROVIDER_SITE_OTHER): Payer: Medicare Other | Admitting: Ophthalmology

## 2019-08-23 ENCOUNTER — Other Ambulatory Visit: Payer: Self-pay

## 2019-08-23 ENCOUNTER — Encounter (INDEPENDENT_AMBULATORY_CARE_PROVIDER_SITE_OTHER): Payer: Self-pay | Admitting: Ophthalmology

## 2019-08-23 ENCOUNTER — Ambulatory Visit (INDEPENDENT_AMBULATORY_CARE_PROVIDER_SITE_OTHER): Payer: Medicare Other | Admitting: Ophthalmology

## 2019-08-23 DIAGNOSIS — H353131 Nonexudative age-related macular degeneration, bilateral, early dry stage: Secondary | ICD-10-CM | POA: Insufficient documentation

## 2019-08-23 DIAGNOSIS — E113591 Type 2 diabetes mellitus with proliferative diabetic retinopathy without macular edema, right eye: Secondary | ICD-10-CM

## 2019-08-23 DIAGNOSIS — E113592 Type 2 diabetes mellitus with proliferative diabetic retinopathy without macular edema, left eye: Secondary | ICD-10-CM | POA: Insufficient documentation

## 2019-09-05 DIAGNOSIS — H3562 Retinal hemorrhage, left eye: Secondary | ICD-10-CM | POA: Insufficient documentation

## 2019-09-05 DIAGNOSIS — H35363 Drusen (degenerative) of macula, bilateral: Secondary | ICD-10-CM | POA: Insufficient documentation

## 2019-09-06 ENCOUNTER — Ambulatory Visit (INDEPENDENT_AMBULATORY_CARE_PROVIDER_SITE_OTHER): Payer: Medicare Other | Admitting: Ophthalmology

## 2019-09-06 ENCOUNTER — Other Ambulatory Visit: Payer: Self-pay

## 2019-09-06 ENCOUNTER — Encounter (INDEPENDENT_AMBULATORY_CARE_PROVIDER_SITE_OTHER): Payer: Self-pay | Admitting: Ophthalmology

## 2019-09-06 DIAGNOSIS — E113552 Type 2 diabetes mellitus with stable proliferative diabetic retinopathy, left eye: Secondary | ICD-10-CM

## 2019-09-06 DIAGNOSIS — H35363 Drusen (degenerative) of macula, bilateral: Secondary | ICD-10-CM

## 2019-09-06 DIAGNOSIS — H3562 Retinal hemorrhage, left eye: Secondary | ICD-10-CM

## 2019-09-06 DIAGNOSIS — E113592 Type 2 diabetes mellitus with proliferative diabetic retinopathy without macular edema, left eye: Secondary | ICD-10-CM | POA: Diagnosis not present

## 2019-09-06 NOTE — Progress Notes (Signed)
09/06/2019     CHIEF COMPLAINT Patient presents for Retina Follow Up   HISTORY OF PRESENT ILLNESS: Tracy Donaldson is a 86 y.o. female who presents to the clinic today for:   HPI    Retina Follow Up    Patient presents with  Diabetic Retinopathy.  In left eye.  This started 2 weeks ago.  Severity is mild.  Duration of 2 weeks.  Since onset it is stable.          Comments    2 Week PRP OS  Pt denies noticeable changes to [place] OU since last visit. Pt denies ocular pain, flashes of light, or floaters OU.  LBS: 150 this AM       Last edited by Rockie Neighbours, [place] on 09/06/2019  2:21 PM. (History)      Referring physician: Deland Pretty, MD [address] [place],  [place] [id number]  HISTORICAL INFORMATION:   Selected notes from the [place]: No current outpatient medications on file. (Ophthalmic Drugs)   No current facility-administered medications for this visit. (Ophthalmic Drugs)   Current Outpatient Medications (Other)  Medication Sig  . amLODipine (NORVASC) 10 MG tablet Take 5 mg by mouth.   Marland Kitchen amLODipine (NORVASC) 5 MG tablet Take 5 mg by mouth daily.  . DULoxetine (CYMBALTA) 20 MG capsule Take 20 mg by mouth daily.  Marland Kitchen glipiZIDE (GLUCOTROL XL) 2.5 MG 24 hr tablet Take 5 mg by mouth daily.  Marland Kitchen glipiZIDE (GLUCOTROL) 5 MG tablet Take 5 mg by mouth 2 (two) times daily.  Marland Kitchen glucose blood (ONE TOUCH ULTRA TEST) test strip CHECK CBG ONCE A DAY IN VITRO 90 DAYS  . glucose blood test strip CHECK CBG ONCE A DAY IN VITRO 90  . losartan (COZAAR) 100 MG tablet Take 100 mg by mouth daily.  Marland Kitchen losartan-hydrochlorothiazide (HYZAAR) 100-12.5 MG tablet Take 1 tablet by mouth daily.  Marland Kitchen losartan-hydrochlorothiazide (HYZAAR) 50-12.5 MG tablet Take by mouth.  . metoprolol succinate (TOPROL-XL) 50 MG 24 hr tablet Take 1 tablet by mouth daily.  . metoprolol tartrate (LOPRESSOR) 50 MG tablet Take by mouth.  . simvastatin (ZOCOR) 5 MG tablet  Take 1 tablet by mouth daily.  . sitaGLIPtin (JANUVIA) 25 MG tablet Take 1 tablet by mouth daily.  Marland Kitchen tretinoin (RETIN-A) 0.05 % cream APPLY ONCE TOPICALLY TO FACE AT BEDTIME   No current facility-administered medications for this visit. (Other)      REVIEW OF SYSTEMS:    ALLERGIES Allergies  Allergen Reactions  . Codeine Hives    PAST MEDICAL HISTORY Past Medical History:  Diagnosis Date  . Chronic kidney disease   . Diabetes mellitus without complication ([place])   . Hyperlipidemia   . Hypertension    Past Surgical History:  Procedure Laterality Date  . APPENDECTOMY    . CATARACT EXTRACTION W/PHACO Right 12/15/2006   unknown  . CATARACT EXTRACTION W/PHACO Left 05/18/2007   unknown  . EYE SURGERY Left    TPPV  . HYSTEROTOMY    . TONSILLECTOMY      FAMILY HISTORY Family History  Problem Relation Age of Onset  . Diabetes Mother   . Hypertension Mother   . Diabetes Father   . Hypertension Father   . Diabetes Sister   . Diabetes Brother     SOCIAL HISTORY Social History   Tobacco Use  . Smoking status: Never Smoker  . Smokeless tobacco: Never Used  Substance  Use Topics  . Alcohol use: Yes    Alcohol/week: 1.0 standard drinks    Types: 1 Glasses of wine per week    Comment: occ  . Drug use: Never         OPHTHALMIC EXAM:  Base Eye Exam    Visual Acuity (ETDRS)      Right Left   Dist cc 20/25 20/25 +1   Correction: Glasses       Tonometry (Tonopen, 2:24 PM)      Right Left   Pressure 15 14       Pupils      Pupils Dark Light Shape React APD   Right PERRL 3 2 Round Slow None   Left PERRL 3 2 Round Slow None       Visual Fields (Counting fingers)      Left Right    Full Full       Extraocular Movement      Right Left    Full Full       Neuro/Psych    Oriented x3: Yes   Mood/Affect: Normal       Dilation    Left eye: 1.0% Mydriacyl, 2.5% Phenylephrine @ 2:24 PM          IMAGING AND PROCEDURES  Imaging and Procedures  for 09/06/19           ASSESSMENT/PLAN:  Proliferative diabetic retinopathy of left eye ([place]) The nature of proliferative diabetic retinopathy was discussed with the patient as well as the potential for a vitreous hemorrhage and traction on the retina. Tight control of glucose, blood pressure, and serum lipids was recommended in order to slow further progression of disease. Cessation of smoking was recommended. Treatment options including ocular injectable medications, panretinal photocoagulation and/or vitrectomy surgery for advanced disease were discussed. Local medical therapy may slow or arrest progression of disease.  OS with an open area temporally needing PRP will complete today      ICD-10-CM   1. Proliferative diabetic retinopathy of left eye without macular edema associated with type 2 diabetes mellitus ([place])  H47.4259 Panretinal Photocoagulation - OS - Left Eye  2. Degenerative retinal drusen of both eyes  H35.363   3. Retinal hemorrhage of left eye  H35.62   4. Stable proliferative diabetic retinopathy of left eye associated with type 2 diabetes mellitus ([place])  D63.8756     1.OS with an open area temporally needing PRP will complete today  2.  Patient is to follow-up with dilation examination OU in 6 months,, fundus photos  3.  Ophthalmic Meds Ordered this visit:  No orders of the defined types were placed in this encounter.        There are no Patient Instructions on file for this visit.   Explained the diagnoses, plan, and follow up with the patient and they expressed understanding.  Patient expressed understanding of the importance of proper follow up care.   Clent Demark Ahmir Bracken M.D. Diseases & Surgery of the Retina and Vitreous Retina & Diabetic [place] 09/06/19     Abbreviations: M myopia (nearsighted); A astigmatism; H hyperopia (farsighted); P presbyopia; Mrx spectacle prescription;  CTL contact lenses; OD right eye; OS left eye; OU both eyes  XT  exotropia; ET esotropia; PEK punctate epithelial keratitis; PEE punctate epithelial erosions; DES dry eye syndrome; MGD meibomian gland dysfunction; ATs artificial tears; PFAT's preservative free artificial tears; Ray nuclear sclerotic cataract; PSC posterior subcapsular cataract; ERM epi-retinal membrane; PVD posterior vitreous detachment; RD retinal  detachment; DM diabetes mellitus; DR diabetic retinopathy; NPDR non-proliferative diabetic retinopathy; PDR proliferative diabetic retinopathy; CSME clinically significant macular edema; DME diabetic macular edema; dbh dot blot hemorrhages; CWS cotton wool spot; POAG primary open angle glaucoma; C/D cup-to-disc ratio; HVF humphrey visual field; GVF goldmann visual field; OCT optical coherence tomography; IOP intraocular pressure; BRVO Branch retinal vein occlusion; CRVO central retinal vein occlusion; CRAO central retinal artery occlusion; BRAO branch retinal artery occlusion; RT retinal tear; SB scleral buckle; PPV pars plana vitrectomy; VH Vitreous hemorrhage; PRP panretinal laser photocoagulation; IVK intravitreal kenalog; VMT vitreomacular traction; MH Macular hole;  NVD neovascularization of the disc; NVE neovascularization elsewhere; AREDS age related eye disease study; ARMD age related macular degeneration; POAG primary open angle glaucoma; EBMD epithelial/anterior basement membrane dystrophy; ACIOL anterior chamber intraocular lens; IOL intraocular lens; PCIOL posterior chamber intraocular lens; Phaco/IOL phacoemulsification with intraocular lens placement; [place] photorefractive keratectomy; LASIK laser assisted in situ keratomileusis; HTN hypertension; DM diabetes mellitus; COPD chronic obstructive pulmonary disease

## 2019-09-06 NOTE — Assessment & Plan Note (Signed)
The nature of proliferative diabetic retinopathy was discussed with the patient as well as the potential for a vitreous hemorrhage and traction on the retina. Tight control of glucose, blood pressure, and serum lipids was recommended in order to slow further progression of disease. Cessation of smoking was recommended. Treatment options including ocular injectable medications, panretinal photocoagulation and/or vitrectomy surgery for advanced disease were discussed. Local medical therapy may slow or arrest progression of disease.  OS with an open area temporally needing PRP will complete today

## 2020-03-06 ENCOUNTER — Ambulatory Visit (INDEPENDENT_AMBULATORY_CARE_PROVIDER_SITE_OTHER): Payer: Medicare Other | Admitting: Ophthalmology

## 2020-03-06 ENCOUNTER — Encounter (INDEPENDENT_AMBULATORY_CARE_PROVIDER_SITE_OTHER): Payer: Self-pay | Admitting: Ophthalmology

## 2020-03-06 ENCOUNTER — Other Ambulatory Visit: Payer: Self-pay

## 2020-03-06 DIAGNOSIS — E113591 Type 2 diabetes mellitus with proliferative diabetic retinopathy without macular edema, right eye: Secondary | ICD-10-CM

## 2020-03-06 DIAGNOSIS — E113592 Type 2 diabetes mellitus with proliferative diabetic retinopathy without macular edema, left eye: Secondary | ICD-10-CM | POA: Diagnosis not present

## 2020-03-06 NOTE — Assessment & Plan Note (Signed)

## 2020-03-06 NOTE — Progress Notes (Signed)
03/06/2020     CHIEF COMPLAINT Patient presents for Retina Follow Up   HISTORY OF PRESENT ILLNESS: Tracy Donaldson is a 86 y.o. female who presents to the clinic today for:   HPI    Retina Follow Up    Patient presents with  Diabetic Retinopathy.  In both eyes.  Severity is moderate.  Duration of 6 months.  Since onset it is stable.  I, the attending physician,  performed the HPI with the patient and updated documentation appropriately.          Comments    6 Month PDR f\u Ou. OCT  Pt states no changes in vision. Denies FOL and floaters BGL: 120 A1C: unknown        Last edited by Tilda Franco on 03/06/2020  2:08 PM. (History)      Referring physician: Deland Pretty, MD [address] [place],  [place] [id number]  HISTORICAL INFORMATION:   Selected notes from the [facility]: No current outpatient medications on file. (Ophthalmic Drugs)   No current facility-administered medications for this visit. (Ophthalmic Drugs)   Current Outpatient Medications (Other)  Medication Sig  . amLODipine (NORVASC) 10 MG tablet Take 5 mg by mouth.   Marland Kitchen amLODipine (NORVASC) 5 MG tablet Take 5 mg by mouth daily.  . DULoxetine (CYMBALTA) 20 MG capsule Take 20 mg by mouth daily.  Marland Kitchen glipiZIDE (GLUCOTROL XL) 2.5 MG 24 hr tablet Take 5 mg by mouth daily.  Marland Kitchen glipiZIDE (GLUCOTROL) 5 MG tablet Take 5 mg by mouth 2 (two) times daily.  Marland Kitchen glucose blood (ONE TOUCH ULTRA TEST) test strip CHECK CBG ONCE A DAY IN VITRO 90 DAYS  . glucose blood test strip CHECK CBG ONCE A DAY IN VITRO 90  . losartan (COZAAR) 100 MG tablet Take 100 mg by mouth daily.  Marland Kitchen losartan-hydrochlorothiazide (HYZAAR) 100-12.5 MG tablet Take 1 tablet by mouth daily.  Marland Kitchen losartan-hydrochlorothiazide (HYZAAR) 50-12.5 MG tablet Take by mouth.  . metoprolol succinate (TOPROL-XL) 50 MG 24 hr tablet Take 1 tablet by mouth daily.  . metoprolol tartrate (LOPRESSOR) 50 MG tablet Take by  mouth.  . ondansetron (ZOFRAN) 8 MG tablet Take by mouth at bedtime.  . simvastatin (ZOCOR) 5 MG tablet Take 1 tablet by mouth daily.  . sitaGLIPtin (JANUVIA) 25 MG tablet Take 1 tablet by mouth daily. (Patient not taking: Reported on 03/06/2020)  . TRESIBA FLEXTOUCH 100 UNIT/ML FlexTouch Pen Inject into the skin.  Marland Kitchen tretinoin (RETIN-A) 0.05 % cream APPLY ONCE TOPICALLY TO FACE AT BEDTIME   No current facility-administered medications for this visit. (Other)      REVIEW OF SYSTEMS: ROS    Positive for: Endocrine   Last edited by Tilda Franco on 03/06/2020  2:08 PM. (History)       ALLERGIES Allergies  Allergen Reactions  . Codeine Hives    PAST MEDICAL HISTORY Past Medical History:  Diagnosis Date  . Chronic kidney disease   . Hyperlipidemia   . Hypertension    Past Surgical History:  Procedure Laterality Date  . APPENDECTOMY    . CATARACT EXTRACTION W/PHACO Right 12/15/2006   unknown  . CATARACT EXTRACTION W/PHACO Left 05/18/2007   unknown  . EYE SURGERY Left    TPPV  . HYSTEROTOMY    . TONSILLECTOMY      FAMILY HISTORY Family History  Problem Relation Age of Onset  . Diabetes Mother   .  Hypertension Mother   . Diabetes Father   . Hypertension Father   . Diabetes Sister   . Diabetes Brother     SOCIAL HISTORY Social History   Tobacco Use  . Smoking status: Never Smoker  . Smokeless tobacco: Never Used  Substance Use Topics  . Alcohol use: Yes    Alcohol/week: 1.0 standard drink    Types: 1 Glasses of wine per week    Comment: occ  . Drug use: Never         OPHTHALMIC EXAM: Base Eye Exam    Visual Acuity (Snellen - Linear)      Right Left   Dist cc 20/30 20/25   Correction: Glasses       Tonometry (Tonopen, 2:12 PM)      Right Left   Pressure 17 18       Pupils      Pupils Dark Light Shape React APD   Right PERRL 3 2 Round Brisk None   Left PERRL 3 2 Round Brisk None       Visual Fields (Counting fingers)      Left  Right    Full Full       Neuro/Psych    Oriented x3: Yes   Mood/Affect: Normal       Dilation    Both eyes: 1.0% Mydriacyl, 2.5% Phenylephrine @ 2:12 PM        Slit Lamp and Fundus Exam    External Exam      Right Left   External Normal Normal       Slit Lamp Exam      Right Left   Lids/Lashes Normal Normal   Conjunctiva/Sclera White and quiet White and quiet   Cornea Clear Clear   Anterior Chamber Deep and quiet Deep and quiet   Iris Round and reactive Round and reactive   Vitreous Normal Normal          IMAGING AND PROCEDURES  Imaging and Procedures for 03/06/20  Color Fundus Photography Optos - OU - Both Eyes       Right Eye Progression has been stable. Disc findings include normal observations. Macula : microaneurysms.   Left Eye Progression has been stable. Disc findings include normal observations.   Notes Diabetic retinopathy, quiescent, good PRP peripherallyIs in place anterior and at the equator.  No active disease OU.  Bilateral proliferative diabetic retinopathy quiescent, good PRP@anterior  to the equator OU.  No active disease.                ASSESSMENT/PLAN:  Proliferative diabetic retinopathy of left eye (HCC) The nature of regressed proliferative diabetic retinopathy was discussed with the patient. The patient was advised to maintain good glucose, blood pressure, monitor kidney function and serum lipid control as advised by personal physician. Rare risk for reactivation of progression exist with untreated severe anemia, untreated renal failure, untreated heart failure, and smoking. Complete avoidance of smoking was recommended. The chance of recurrent proliferative diabetic retinopathy was discussed as well as the chance of vitreous hemorrhage for which further treatments may be necessary.   Explained to the patient that the quiescent  proliferative diabetic retinopathy disease is unlikely to ever worsen.  Worsening factors would include  however severe anemia, hypertension out-of-control or impending renal failure.      ICD-10-CM   1. Proliferative diabetic retinopathy of left eye without macular edema associated with type 2 diabetes mellitus (HCC)  W23.7628 Color Fundus Photography Optos - OU - Both Eyes  2. Proliferative diabetic retinopathy of right eye associated with type 2 diabetes mellitus, unspecified proliferative retinopathy type ([place])  M01.0272 Color Fundus Photography Optos - OU - Both Eyes    1.  Bilateral quiescent proliferative diabetic retinopathy, good PRP 360 OU, stable, no active disease will observe  2.  3.  Ophthalmic Meds Ordered this visit:  No orders of the defined types were placed in this encounter.      Return in about 9 months (around 12/04/2020) for COLOR FP, dilate, OCT.  There are no Patient Instructions on file for this visit.   Explained the diagnoses, plan, and follow up with the patient and they expressed understanding.  Patient expressed understanding of the importance of proper follow up care.   Clent Demark Shivaay Stormont M.D. Diseases & Surgery of the Retina and Vitreous [facility] [place] 03/06/20     Abbreviations: M myopia (nearsighted); A astigmatism; H hyperopia (farsighted); P presbyopia; Mrx spectacle prescription;  CTL contact lenses; OD right eye; OS left eye; OU both eyes  XT exotropia; ET esotropia; PEK punctate epithelial keratitis; PEE punctate epithelial erosions; DES dry eye syndrome; MGD meibomian gland dysfunction; ATs artificial tears; PFAT's preservative free artificial tears; [place] nuclear sclerotic cataract; PSC posterior subcapsular cataract; ERM epi-retinal membrane; PVD posterior vitreous detachment; RD retinal detachment; DM diabetes mellitus; DR diabetic retinopathy; NPDR non-proliferative diabetic retinopathy; PDR proliferative diabetic retinopathy; CSME clinically significant macular edema; DME diabetic macular edema; dbh dot blot hemorrhages; CWS cotton  wool spot; POAG primary open angle glaucoma; C/D cup-to-disc ratio; HVF humphrey visual field; GVF goldmann visual field; OCT optical coherence tomography; IOP intraocular pressure; BRVO Branch retinal vein occlusion; CRVO central retinal vein occlusion; CRAO central retinal artery occlusion; BRAO branch retinal artery occlusion; RT retinal tear; SB scleral buckle; PPV pars plana vitrectomy; VH Vitreous hemorrhage; PRP panretinal laser photocoagulation; IVK intravitreal kenalog; VMT vitreomacular traction; MH Macular hole;  NVD neovascularization of the disc; NVE neovascularization elsewhere; AREDS age related eye disease study; ARMD age related macular degeneration; POAG primary open angle glaucoma; EBMD epithelial/anterior basement membrane dystrophy; ACIOL anterior chamber intraocular lens; IOL intraocular lens; PCIOL posterior chamber intraocular lens; Phaco/IOL phacoemulsification with intraocular lens placement; [place] photorefractive keratectomy; LASIK laser assisted in situ keratomileusis; HTN hypertension; DM diabetes mellitus; COPD chronic obstructive pulmonary disease

## 2020-03-07 ENCOUNTER — Encounter (INDEPENDENT_AMBULATORY_CARE_PROVIDER_SITE_OTHER): Payer: Medicare Other | Admitting: Ophthalmology

## 2020-06-13 ENCOUNTER — Emergency Department (HOSPITAL_COMMUNITY): Payer: Medicare Other

## 2020-06-13 ENCOUNTER — Encounter (HOSPITAL_COMMUNITY): Payer: Self-pay | Admitting: *Deleted

## 2020-06-13 ENCOUNTER — Inpatient Hospital Stay (HOSPITAL_COMMUNITY)
Admission: EM | Admit: 2020-06-13 | Discharge: 2020-07-08 | DRG: 296 | Disposition: E | Payer: Medicare Other | Attending: Internal Medicine | Admitting: Internal Medicine

## 2020-06-13 ENCOUNTER — Other Ambulatory Visit: Payer: Self-pay

## 2020-06-13 ENCOUNTER — Inpatient Hospital Stay (HOSPITAL_COMMUNITY): Payer: Medicare Other

## 2020-06-13 DIAGNOSIS — E11319 Type 2 diabetes mellitus with unspecified diabetic retinopathy without macular edema: Secondary | ICD-10-CM | POA: Diagnosis present

## 2020-06-13 DIAGNOSIS — N170 Acute kidney failure with tubular necrosis: Secondary | ICD-10-CM | POA: Diagnosis not present

## 2020-06-13 DIAGNOSIS — Z833 Family history of diabetes mellitus: Secondary | ICD-10-CM

## 2020-06-13 DIAGNOSIS — Z20822 Contact with and (suspected) exposure to covid-19: Secondary | ICD-10-CM | POA: Diagnosis present

## 2020-06-13 DIAGNOSIS — Y848 Other medical procedures as the cause of abnormal reaction of the patient, or of later complication, without mention of misadventure at the time of the procedure: Secondary | ICD-10-CM | POA: Diagnosis present

## 2020-06-13 DIAGNOSIS — W109XXA Fall (on) (from) unspecified stairs and steps, initial encounter: Secondary | ICD-10-CM | POA: Diagnosis present

## 2020-06-13 DIAGNOSIS — S270XXA Traumatic pneumothorax, initial encounter: Secondary | ICD-10-CM | POA: Diagnosis present

## 2020-06-13 DIAGNOSIS — N179 Acute kidney failure, unspecified: Secondary | ICD-10-CM | POA: Diagnosis not present

## 2020-06-13 DIAGNOSIS — E1165 Type 2 diabetes mellitus with hyperglycemia: Secondary | ICD-10-CM | POA: Diagnosis present

## 2020-06-13 DIAGNOSIS — N171 Acute kidney failure with acute cortical necrosis: Secondary | ICD-10-CM | POA: Diagnosis not present

## 2020-06-13 DIAGNOSIS — Z515 Encounter for palliative care: Secondary | ICD-10-CM | POA: Diagnosis not present

## 2020-06-13 DIAGNOSIS — I130 Hypertensive heart and chronic kidney disease with heart failure and stage 1 through stage 4 chronic kidney disease, or unspecified chronic kidney disease: Secondary | ICD-10-CM | POA: Diagnosis present

## 2020-06-13 DIAGNOSIS — I447 Left bundle-branch block, unspecified: Secondary | ICD-10-CM | POA: Diagnosis present

## 2020-06-13 DIAGNOSIS — Z66 Do not resuscitate: Secondary | ICD-10-CM | POA: Diagnosis not present

## 2020-06-13 DIAGNOSIS — E785 Hyperlipidemia, unspecified: Secondary | ICD-10-CM | POA: Diagnosis present

## 2020-06-13 DIAGNOSIS — R579 Shock, unspecified: Secondary | ICD-10-CM | POA: Diagnosis present

## 2020-06-13 DIAGNOSIS — G253 Myoclonus: Secondary | ICD-10-CM | POA: Diagnosis present

## 2020-06-13 DIAGNOSIS — E872 Acidosis, unspecified: Secondary | ICD-10-CM

## 2020-06-13 DIAGNOSIS — Z8249 Family history of ischemic heart disease and other diseases of the circulatory system: Secondary | ICD-10-CM | POA: Diagnosis not present

## 2020-06-13 DIAGNOSIS — G928 Other toxic encephalopathy: Secondary | ICD-10-CM | POA: Diagnosis present

## 2020-06-13 DIAGNOSIS — E1122 Type 2 diabetes mellitus with diabetic chronic kidney disease: Secondary | ICD-10-CM | POA: Diagnosis present

## 2020-06-13 DIAGNOSIS — N184 Chronic kidney disease, stage 4 (severe): Secondary | ICD-10-CM | POA: Diagnosis present

## 2020-06-13 DIAGNOSIS — E871 Hypo-osmolality and hyponatremia: Secondary | ICD-10-CM | POA: Diagnosis present

## 2020-06-13 DIAGNOSIS — J9600 Acute respiratory failure, unspecified whether with hypoxia or hypercapnia: Secondary | ICD-10-CM

## 2020-06-13 DIAGNOSIS — Z7984 Long term (current) use of oral hypoglycemic drugs: Secondary | ICD-10-CM

## 2020-06-13 DIAGNOSIS — S2241XA Multiple fractures of ribs, right side, initial encounter for closed fracture: Secondary | ICD-10-CM | POA: Diagnosis present

## 2020-06-13 DIAGNOSIS — E875 Hyperkalemia: Secondary | ICD-10-CM | POA: Diagnosis present

## 2020-06-13 DIAGNOSIS — I2510 Atherosclerotic heart disease of native coronary artery without angina pectoris: Secondary | ICD-10-CM | POA: Diagnosis present

## 2020-06-13 DIAGNOSIS — T1490XA Injury, unspecified, initial encounter: Secondary | ICD-10-CM

## 2020-06-13 DIAGNOSIS — G931 Anoxic brain damage, not elsewhere classified: Secondary | ICD-10-CM | POA: Diagnosis present

## 2020-06-13 DIAGNOSIS — M9689 Other intraoperative and postprocedural complications and disorders of the musculoskeletal system: Secondary | ICD-10-CM | POA: Diagnosis present

## 2020-06-13 DIAGNOSIS — I469 Cardiac arrest, cause unspecified: Secondary | ICD-10-CM | POA: Diagnosis present

## 2020-06-13 DIAGNOSIS — Y9301 Activity, walking, marching and hiking: Secondary | ICD-10-CM | POA: Diagnosis present

## 2020-06-13 DIAGNOSIS — Z794 Long term (current) use of insulin: Secondary | ICD-10-CM

## 2020-06-13 DIAGNOSIS — Z79899 Other long term (current) drug therapy: Secondary | ICD-10-CM

## 2020-06-13 DIAGNOSIS — J9601 Acute respiratory failure with hypoxia: Secondary | ICD-10-CM | POA: Diagnosis present

## 2020-06-13 DIAGNOSIS — I5043 Acute on chronic combined systolic (congestive) and diastolic (congestive) heart failure: Secondary | ICD-10-CM | POA: Diagnosis present

## 2020-06-13 DIAGNOSIS — W19XXXA Unspecified fall, initial encounter: Secondary | ICD-10-CM | POA: Diagnosis present

## 2020-06-13 DIAGNOSIS — Z885 Allergy status to narcotic agent status: Secondary | ICD-10-CM

## 2020-06-13 DIAGNOSIS — I358 Other nonrheumatic aortic valve disorders: Secondary | ICD-10-CM | POA: Diagnosis present

## 2020-06-13 DIAGNOSIS — E119 Type 2 diabetes mellitus without complications: Secondary | ICD-10-CM

## 2020-06-13 DIAGNOSIS — R778 Other specified abnormalities of plasma proteins: Secondary | ICD-10-CM | POA: Diagnosis present

## 2020-06-13 DIAGNOSIS — J939 Pneumothorax, unspecified: Secondary | ICD-10-CM

## 2020-06-13 DIAGNOSIS — K30 Functional dyspepsia: Secondary | ICD-10-CM | POA: Diagnosis present

## 2020-06-13 HISTORY — DX: Heart failure, unspecified: I50.9

## 2020-06-13 HISTORY — DX: Atherosclerotic heart disease of native coronary artery without angina pectoris: I25.10

## 2020-06-13 LAB — URINALYSIS, ROUTINE W REFLEX MICROSCOPIC
Bilirubin Urine: NEGATIVE
Glucose, UA: >=500 mg/dL — AB
Ketones, ur: NEGATIVE mg/dL
Leukocytes,Ua: NEGATIVE
Nitrite: NEGATIVE
Protein, ur: >=300 mg/dL — AB
Specific Gravity, Urine: 1.014 (ref 1.005–1.030)
pH: 5 (ref 5.0–8.0)

## 2020-06-13 LAB — CBC
HCT: 33.1 % — ABNORMAL LOW (ref 36.0–46.0)
HCT: 33.6 % — ABNORMAL LOW (ref 36.0–46.0)
Hemoglobin: 10.7 g/dL — ABNORMAL LOW (ref 12.0–15.0)
Hemoglobin: 11.4 g/dL — ABNORMAL LOW (ref 12.0–15.0)
MCH: 30 pg (ref 26.0–34.0)
MCH: 29.8 pg (ref 26.0–34.0)
MCHC: 32.3 g/dL (ref 30.0–36.0)
MCHC: 33.9 g/dL (ref 30.0–36.0)
MCV: 92.7 fL (ref 80.0–100.0)
MCV: 87.7 fL (ref 80.0–100.0)
Platelets: 268 10*3/uL (ref 150–400)
Platelets: 228 10*3/uL (ref 150–400)
RBC: 3.57 MIL/uL — ABNORMAL LOW (ref 3.87–5.11)
RBC: 3.83 MIL/uL — ABNORMAL LOW (ref 3.87–5.11)
RDW: 12.6 % (ref 11.5–15.5)
RDW: 12.8 % (ref 11.5–15.5)
WBC: 23.2 10*3/uL — ABNORMAL HIGH (ref 4.0–10.5)
WBC: 27 10*3/uL — ABNORMAL HIGH (ref 4.0–10.5)
nRBC: 0 % (ref 0.0–0.2)
nRBC: 0 % (ref 0.0–0.2)

## 2020-06-13 LAB — BASIC METABOLIC PANEL
Anion gap: 20 — ABNORMAL HIGH (ref 5–15)
Anion gap: 15 (ref 5–15)
BUN: 61 mg/dL — ABNORMAL HIGH (ref 8–23)
BUN: 63 mg/dL — ABNORMAL HIGH (ref 8–23)
CO2: 10 mmol/L — ABNORMAL LOW (ref 22–32)
CO2: 15 mmol/L — ABNORMAL LOW (ref 22–32)
Calcium: 8 mg/dL — ABNORMAL LOW (ref 8.9–10.3)
Calcium: 7.9 mg/dL — ABNORMAL LOW (ref 8.9–10.3)
Chloride: 92 mmol/L — ABNORMAL LOW (ref 98–111)
Chloride: 92 mmol/L — ABNORMAL LOW (ref 98–111)
Creatinine, Ser: 2 mg/dL — ABNORMAL HIGH (ref 0.44–1.00)
Creatinine, Ser: 1.95 mg/dL — ABNORMAL HIGH (ref 0.44–1.00)
GFR, Estimated: 24 mL/min — ABNORMAL LOW (ref >60)
GFR, Estimated: 24 mL/min — ABNORMAL LOW (ref >60)
Glucose, Bld: 466 mg/dL — ABNORMAL HIGH (ref 70–99)
Glucose, Bld: 463 mg/dL — ABNORMAL HIGH (ref 70–99)
Potassium: 5.5 mmol/L — ABNORMAL HIGH (ref 3.5–5.1)
Potassium: 5.6 mmol/L — ABNORMAL HIGH (ref 3.5–5.1)
Sodium: 122 mmol/L — ABNORMAL LOW (ref 135–145)
Sodium: 122 mmol/L — ABNORMAL LOW (ref 135–145)

## 2020-06-13 LAB — PROTIME-INR
INR: 1.3 — ABNORMAL HIGH (ref 0.8–1.2)
INR: 1.2 (ref 0.8–1.2)
Prothrombin Time: 15.4 seconds — ABNORMAL HIGH (ref 11.4–15.2)
Prothrombin Time: 14.7 seconds (ref 11.4–15.2)

## 2020-06-13 LAB — I-STAT CHEM 8, ED
BUN: 67 mg/dL — ABNORMAL HIGH (ref 8–23)
Calcium, Ion: 1.03 mmol/L — ABNORMAL LOW (ref 1.15–1.40)
Chloride: 92 mmol/L — ABNORMAL LOW (ref 98–111)
Creatinine, Ser: 1.8 mg/dL — ABNORMAL HIGH (ref 0.44–1.00)
Glucose, Bld: 457 mg/dL — ABNORMAL HIGH (ref 70–99)
HCT: 32 % — ABNORMAL LOW (ref 36.0–46.0)
Hemoglobin: 10.9 g/dL — ABNORMAL LOW (ref 12.0–15.0)
Potassium: 5.4 mmol/L — ABNORMAL HIGH (ref 3.5–5.1)
Sodium: 121 mmol/L — ABNORMAL LOW (ref 135–145)
TCO2: 15 mmol/L — ABNORMAL LOW (ref 22–32)

## 2020-06-13 LAB — I-STAT ARTERIAL BLOOD GAS, ED
Acid-base deficit: 12 mmol/L — ABNORMAL HIGH (ref 0.0–2.0)
Bicarbonate: 16.1 mmol/L — ABNORMAL LOW (ref 20.0–28.0)
Calcium, Ion: 1.11 mmol/L — ABNORMAL LOW (ref 1.15–1.40)
HCT: 33 % — ABNORMAL LOW (ref 36.0–46.0)
Hemoglobin: 11.2 g/dL — ABNORMAL LOW (ref 12.0–15.0)
O2 Saturation: 95 %
Patient temperature: 98.7
Potassium: 5.9 mmol/L — ABNORMAL HIGH (ref 3.5–5.1)
Sodium: 122 mmol/L — ABNORMAL LOW (ref 135–145)
TCO2: 17 mmol/L — ABNORMAL LOW (ref 22–32)
pCO2 arterial: 46.5 mmHg (ref 32.0–48.0)
pH, Arterial: 7.147 — CL (ref 7.350–7.450)
pO2, Arterial: 99 mmHg (ref 83.0–108.0)

## 2020-06-13 LAB — TROPONIN I (HIGH SENSITIVITY)
Troponin I (High Sensitivity): 396 ng/L (ref <18)
Troponin I (High Sensitivity): 1242 ng/L (ref <18)

## 2020-06-13 LAB — LACTIC ACID, PLASMA
Lactic Acid, Venous: >11 mmol/L (ref 0.5–1.9)
Lactic Acid, Venous: 4.9 mmol/L (ref 0.5–1.9)

## 2020-06-13 LAB — SAMPLE TO BLOOD BANK

## 2020-06-13 LAB — APTT: aPTT: 27 seconds (ref 24–36)

## 2020-06-13 LAB — ETHANOL: Alcohol, Ethyl (B): <10 mg/dL (ref <10)

## 2020-06-13 LAB — SARS CORONAVIRUS 2 BY RT PCR (HOSPITAL ORDER, PERFORMED IN ~~LOC~~ HOSPITAL LAB): SARS Coronavirus 2: NEGATIVE

## 2020-06-13 LAB — MAGNESIUM: Magnesium: 1.9 mg/dL (ref 1.7–2.4)

## 2020-06-13 MED ORDER — SODIUM CHLORIDE 0.9 % IV SOLN: INTRAVENOUS | Status: DC

## 2020-06-13 MED ORDER — ETOMIDATE 2 MG/ML IV SOLN
INTRAVENOUS | Status: AC | PRN
  Administered 2020-06-13: 30 mg via INTRAVENOUS

## 2020-06-13 MED ORDER — ETOMIDATE 2 MG/ML IV SOLN: INTRAVENOUS | Status: AC | PRN

## 2020-06-13 MED ORDER — FENTANYL CITRATE (PF) 100 MCG/2ML IJ SOLN: 25.0000 ug | INTRAMUSCULAR | Status: DC | PRN

## 2020-06-13 MED ORDER — INSULIN ASPART 100 UNIT/ML ~~LOC~~ SOLN: 2.0000 [IU] | SUBCUTANEOUS | Status: DC

## 2020-06-13 MED ORDER — EPINEPHRINE HCL 5 MG/250ML IV SOLN IN NS
INTRAVENOUS | Status: AC
  Administered 2020-06-13: 10 ug/min via INTRAVENOUS
  Filled 2020-06-13: qty 250

## 2020-06-13 MED ORDER — FENTANYL CITRATE (PF) 100 MCG/2ML IJ SOLN
25.0000 ug | INTRAMUSCULAR | Status: DC | PRN
Start: 2020-06-13 — End: 2020-06-15
  Administered 2020-06-14: 50 ug via INTRAVENOUS
  Administered 2020-06-14 – 2020-06-15 (×2): 100 ug via INTRAVENOUS
  Filled 2020-06-13 (×3): qty 2

## 2020-06-13 MED ORDER — ROCURONIUM BROMIDE 50 MG/5ML IV SOLN
INTRAVENOUS | Status: AC | PRN
  Administered 2020-06-13: 70 mg via INTRAVENOUS

## 2020-06-13 MED ORDER — PANTOPRAZOLE SODIUM 40 MG IV SOLR
40.0000 mg | Freq: Every day | INTRAVENOUS | Status: DC
  Administered 2020-06-13 – 2020-06-14 (×2): 40 mg via INTRAVENOUS
  Filled 2020-06-13 (×2): qty 40

## 2020-06-13 MED ORDER — PROPOFOL 1000 MG/100ML IV EMUL
INTRAVENOUS | Status: AC
  Administered 2020-06-13: 5 ug/kg/min via INTRAVENOUS
  Filled 2020-06-13: qty 100

## 2020-06-13 MED ORDER — SODIUM CHLORIDE 0.9 % IV BOLUS
1000.0000 mL | Freq: Once | INTRAVENOUS | Status: AC
  Administered 2020-06-13: 1000 mL via INTRAVENOUS

## 2020-06-13 MED ORDER — PIPERACILLIN-TAZOBACTAM IN DEX 2-0.25 GM/50ML IV SOLN
2.2500 g | Freq: Three times a day (TID) | INTRAVENOUS | Status: DC
  Administered 2020-06-13 – 2020-06-15 (×5): 2.25 g via INTRAVENOUS
  Filled 2020-06-13 (×10): qty 50

## 2020-06-13 MED ORDER — PROPOFOL 1000 MG/100ML IV EMUL
5.0000 ug/kg/min | INTRAVENOUS | Status: DC
  Administered 2020-06-14: 30 ug/kg/min via INTRAVENOUS
  Administered 2020-06-14: 20 ug/kg/min via INTRAVENOUS
  Administered 2020-06-15 (×2): 30 ug/kg/min via INTRAVENOUS
  Filled 2020-06-13 (×4): qty 100

## 2020-06-13 MED ORDER — DOCUSATE SODIUM 50 MG/5ML PO LIQD
100.0000 mg | Freq: Two times a day (BID) | ORAL | Status: DC
  Administered 2020-06-14 (×2): 100 mg
  Filled 2020-06-13 (×3): qty 10

## 2020-06-13 MED ORDER — EPINEPHRINE HCL 5 MG/250ML IV SOLN IN NS: 0.5000 ug/min | INTRAVENOUS | Status: DC

## 2020-06-13 MED ORDER — POLYETHYLENE GLYCOL 3350 17 G PO PACK
17.0000 g | PACK | Freq: Every day | ORAL | Status: DC
  Administered 2020-06-14: 17 g
  Filled 2020-06-13: qty 1

## 2020-06-13 MED ORDER — HEPARIN SODIUM (PORCINE) 5000 UNIT/ML IJ SOLN
5000.0000 [IU] | Freq: Three times a day (TID) | INTRAMUSCULAR | Status: DC
  Administered 2020-06-13 – 2020-06-15 (×5): 5000 [IU] via SUBCUTANEOUS
  Filled 2020-06-13 (×5): qty 1

## 2020-06-13 NOTE — Progress Notes (Signed)
RT obtained ABG with the following results. Critical results reported to MD [place]. Rate increased to 30 and leave pt on 8ccs for now. RT will continue to monitor.   Results for SHADIYAH, WERNLI (MRN [id number]) as of 06/20/2020 23:15  Ref. Range 06/29/2020 20:28  Sample type Unknown ARTERIAL  pH, Arterial Latest Ref Range: 7.350 - 7.450  7.147 (LL)  pCO2 arterial Latest Ref Range: 32.0 - 48.0 mmHg 46.5  pO2, Arterial Latest Ref Range: 83.0 - 108.0 mmHg 99  TCO2 Latest Ref Range: 22 - 32 mmol/L 17 (L)  Acid-base deficit Latest Ref Range: 0.0 - 2.0 mmol/L 12.0 (H)  Bicarbonate Latest Ref Range: 20.0 - 28.0 mmol/L 16.1 (L)  O2 Saturation Latest Units: % 95.0  Patient temperature Unknown 98.7 F  Collection site Unknown Brachial

## 2020-06-13 NOTE — ED Provider Notes (Signed)
[facility] Provider Note   CSN: [id number] Arrival date & time: 06/27/2020  1750     History No chief complaint on file.   Tracy Donaldson is a 87 y.o. female.  The history is provided by the EMS personnel.  Cardiac Arrest Witnessed by:  Family member Incident location:  Home Time since incident:  30 minutes Time before BLS initiated:  1-2 minutes Time before ALS initiated:  3-5 minutes Condition upon EMS arrival:  Apneic and unresponsive Pulse:  Absent Initial cardiac rhythm per EMS:  PEA Treatments prior to arrival:  ACLS protocol Medications given prior to ED:  Epinephrine Airway: king airway. Rhythm on admission to ED: paced.      Past Medical History:  Diagnosis Date  . CHF (congestive heart failure) ([place])   . Chronic kidney disease   . Coronary artery disease   . Hyperlipidemia   . Hypertension     Patient Active Problem List   Diagnosis Date Noted  . Cardiac arrest ([place]) 06/28/2020  . Acute respiratory failure with hypoxia ([place]) 06/26/2020  . Lactic acidosis 06/23/2020  . Acute renal failure (ARF) ([place]) 06/14/2020  . Hyperkalemia 06/12/2020  . Hyponatremia 06/12/2020  . Diabetes mellitus ([place]) 06/16/2020  . Pneumothorax, traumatic   . Degenerative retinal drusen of both eyes 09/05/2019  . Retinal hemorrhage of left eye 09/05/2019  . Proliferative diabetic retinopathy of right eye ([place]) 08/23/2019  . Proliferative diabetic retinopathy of left eye ([place]) 08/23/2019  . Early stage nonexudative age-related macular degeneration of both eyes 08/23/2019  . Essential hypertension 10/05/2018  . LBBB (left bundle branch block) 10/05/2018    Past Surgical History:  Procedure Laterality Date  . APPENDECTOMY    . CATARACT EXTRACTION W/PHACO Right 12/15/2006   unknown  . CATARACT EXTRACTION W/PHACO Left 05/18/2007   unknown  . EYE SURGERY Left    TPPV  . HYSTEROTOMY    . TONSILLECTOMY       OB History   No obstetric  history on file.     Family History  Problem Relation Age of Onset  . Diabetes Mother   . Hypertension Mother   . Diabetes Father   . Hypertension Father   . Diabetes Sister   . Diabetes Brother     Social History   Tobacco Use  . Smoking status: Never Smoker  . Smokeless tobacco: Never Used  Substance Use Topics  . Alcohol use: Yes    Alcohol/week: 1.0 standard drink    Types: 1 Glasses of wine per week    Comment: occ  . Drug use: Never    Home Medications Prior to Admission medications   Medication Sig Start Date End Date Taking? Authorizing Provider  amLODipine (NORVASC) 10 MG tablet Take 5 mg by mouth.     [provider]  amLODipine (NORVASC) 5 MG tablet Take 5 mg by mouth daily. 06/16/19   [provider]  DULoxetine (CYMBALTA) 20 MG capsule Take 20 mg by mouth daily. 07/21/19   [provider]  glipiZIDE (GLUCOTROL XL) 2.5 MG 24 hr tablet Take 5 mg by mouth daily. 07/19/18   [provider]  glipiZIDE (GLUCOTROL) 5 MG tablet Take 5 mg by mouth 2 (two) times daily. 06/23/19   [provider]  glucose blood (ONE TOUCH ULTRA TEST) test strip CHECK CBG ONCE A DAY IN VITRO 90 DAYS 06/27/18   [provider]  glucose blood test strip CHECK CBG ONCE A DAY IN VITRO  90 08/29/18   [provider]  losartan (COZAAR) 100 MG tablet Take 100 mg by mouth daily. 08/15/19   [provider]  losartan-hydrochlorothiazide (HYZAAR) 100-12.5 MG tablet Take 1 tablet by mouth daily. 07/19/18   [provider]  losartan-hydrochlorothiazide (HYZAAR) 50-12.5 MG tablet Take by mouth.    [provider]  metoprolol succinate (TOPROL-XL) 50 MG 24 hr tablet Take 1 tablet by mouth daily. 07/19/18   [provider]  metoprolol tartrate (LOPRESSOR) 50 MG tablet Take by mouth.    [provider]  ondansetron (ZOFRAN) 8 MG tablet Take by mouth at bedtime. 02/20/20   [provider]  simvastatin  (ZOCOR) 5 MG tablet Take 1 tablet by mouth daily. 09/04/18   [provider]  sitaGLIPtin (JANUVIA) 25 MG tablet Take 1 tablet by mouth daily. Patient not taking: Reported on 03/06/2020    [provider]  TRESIBA FLEXTOUCH 100 UNIT/ML FlexTouch Pen Inject into the skin. 02/20/20   [provider]  tretinoin (RETIN-A) 0.05 % cream APPLY ONCE TOPICALLY TO FACE AT BEDTIME 04/23/19   [provider]    Allergies    Codeine  Review of Systems   Review of Systems  All other systems reviewed and are negative.   Physical Exam Updated Vital Signs BP (!) 157/68   Pulse (!) 109   Temp (!) 96.4 F (35.8 C)   Resp (!) 30   Ht _0  (1.6 m) Comment: measured x 3  Wt 56.7 kg   SpO2 100%   BMI 22.14 kg/m   Physical Exam Constitutional:      Appearance: She is toxic-appearing.     Interventions: Backboard in place.     Comments: King airway in place  HENT:     Head: Normocephalic and atraumatic.     Mouth/Throat:     Lips: Pink.     Mouth: Mucous membranes are dry.  Eyes:     Pupils: Pupils are equal.     Right eye: Pupil is sluggish. Pupil is round.     Left eye: Pupil is sluggish. Pupil is round.  Neck:     Comments: C-collar in place Cardiovascular:     Rate and Rhythm: Normal rate. Rhythm irregular. Occasional extrasystoles are present.    Pulses:          Radial pulses are 2+ on the right side and 2+ on the left side.       Femoral pulses are 2+ on the right side and 2+ on the left side.    Heart sounds: Normal heart sounds.  Pulmonary:     Breath sounds: No wheezing or rhonchi.     Comments: Mechanically ventilated breath sounds Chest:     Chest wall: No crepitus or edema.  Abdominal:     General: There is no distension.     Palpations: Abdomen is soft.  Musculoskeletal:     Cervical back: No edema or erythema.     Right lower leg: No edema.     Left lower leg: No edema.     Comments: Some bruising overlying R elbow  Skin:     Findings: Abrasion present. No rash.     Comments: Skin tear to arm  Neurological:     Mental Status: She is unresponsive.     GCS: GCS eye subscore is 1. GCS verbal subscore is 1. GCS motor subscore is 1.     ED Results / Procedures / Treatments   Labs (all  labs ordered are listed, but only abnormal results are displayed) Labs Reviewed  BASIC METABOLIC PANEL - Abnormal; Notable for the following components:      Result Value   Sodium 122 (*)    Potassium 5.5 (*)    Chloride 92 (*)    CO2 10 (*)    Glucose, Bld 466 (*)    BUN 61 (*)    Creatinine, Ser 2.00 (*)    Calcium 8.0 (*)    GFR, Estimated 24 (*)    Anion gap 20 (*)    All other components within normal limits  CBC - Abnormal; Notable for the following components:   WBC 23.2 (*)    RBC 3.57 (*)    Hemoglobin 10.7 (*)    HCT 33.1 (*)    All other components within normal limits  LACTIC ACID, PLASMA - Abnormal; Notable for the following components:   Lactic Acid, Venous >11.0 (*)    All other components within normal limits  URINALYSIS, ROUTINE W REFLEX MICROSCOPIC - Abnormal; Notable for the following components:   Color, Urine AMBER (*)    APPearance CLOUDY (*)    Glucose, UA >=500 (*)    Hgb urine dipstick SMALL (*)    Protein, ur >=300 (*)    Bacteria, UA FEW (*)    Non Squamous Epithelial 0-5 (*)    All other components within normal limits  PROTIME-INR - Abnormal; Notable for the following components:   Prothrombin Time 15.4 (*)    INR 1.3 (*)    All other components within normal limits  LACTIC ACID, PLASMA - Abnormal; Notable for the following components:   Lactic Acid, Venous 4.9 (*)    All other components within normal limits  BASIC METABOLIC PANEL - Abnormal; Notable for the following components:   Sodium 122 (*)    Potassium 5.6 (*)    Chloride 92 (*)    CO2 15 (*)    Glucose, Bld 463 (*)    BUN 63 (*)    Creatinine, Ser 1.95 (*)    Calcium 7.9 (*)    GFR, Estimated 24 (*)    All other  components within normal limits  CBC - Abnormal; Notable for the following components:   WBC 27.0 (*)    RBC 3.83 (*)    Hemoglobin 11.4 (*)    HCT 33.6 (*)    All other components within normal limits  I-STAT CHEM 8, ED - Abnormal; Notable for the following components:   Sodium 121 (*)    Potassium 5.4 (*)    Chloride 92 (*)    BUN 67 (*)    Creatinine, Ser 1.80 (*)    Glucose, Bld 457 (*)    Calcium, Ion 1.03 (*)    TCO2 15 (*)    Hemoglobin 10.9 (*)    HCT 32.0 (*)    All other components within normal limits  I-STAT ARTERIAL BLOOD GAS, ED - Abnormal; Notable for the following components:   pH, Arterial 7.147 (*)    Bicarbonate 16.1 (*)    TCO2 17 (*)    Acid-base deficit 12.0 (*)    Sodium 122 (*)    Potassium 5.9 (*)    Calcium, Ion 1.11 (*)    HCT 33.0 (*)    Hemoglobin 11.2 (*)    All other components within normal limits  TROPONIN I (HIGH SENSITIVITY) - Abnormal; Notable for the following components:   Troponin I (High Sensitivity) 396 (*)  All other components within normal limits  TROPONIN I (HIGH SENSITIVITY) - Abnormal; Notable for the following components:   Troponin I (High Sensitivity) 1,242 (*)    All other components within normal limits  SARS CORONAVIRUS 2 BY RT PCR (HOSPITAL ORDER, [place] [facility])  CULTURE, BLOOD (ROUTINE X 2)  CULTURE, BLOOD (ROUTINE X 2)  URINE CULTURE  CULTURE, RESPIRATORY  MAGNESIUM  ETHANOL  PROTIME-INR  APTT  BLOOD GAS, ARTERIAL  BASIC METABOLIC PANEL  CBC  BLOOD GAS, ARTERIAL  MAGNESIUM  LACTIC ACID, PLASMA  HEPATIC FUNCTION PANEL  SAMPLE TO BLOOD BANK    EKG EKG Interpretation  Date/Time:  Friday June 13 2020 18:11:32 EST Ventricular Rate:  76 PR Interval:    QRS Duration: 132 QT Interval:  369 QTC Calculation: 415 R Axis:   68 Text Interpretation: Sinus rhythm Borderline prolonged PR interval Left bundle branch block No prior ECG for comparison. Some ST elevations in leads V1-V3  and t wave inversion sin leads 2,3 AVF and V5-V6. No STEMI Confirmed by Antony Blackbird [id number]) on 06/17/2020 6:19:37 PM   Radiology CT HEAD WO CONTRAST  Result Date: 07/03/2020 CLINICAL DATA:  Poly trauma, critical head and C-spine injury. EXAM: CT HEAD WITHOUT CONTRAST CT CERVICAL SPINE WITHOUT CONTRAST TECHNIQUE: Multidetector CT imaging of the head and cervical spine was performed following the standard protocol without intravenous contrast. Multiplanar CT image reconstructions of the cervical spine were also generated. COMPARISON:  None. FINDINGS: CT HEAD FINDINGS Brain: No evidence of acute infarction, hemorrhage, extra-axial collection, ventriculomegaly, or mass effect. Generalized cerebral atrophy. Periventricular white matter low attenuation likely secondary to microangiopathy. Vascular: Cerebrovascular atherosclerotic calcifications are noted. Skull: Negative for fracture or focal lesion. Old posttraumatic deformity of bilateral nasal bones. Sinuses/Orbits: Visualized portions of the orbits are unremarkable. Visualized portions of the paranasal sinuses are unremarkable. Visualized portions of the mastoid air cells are unremarkable. Other: None. CT CERVICAL SPINE FINDINGS Alignment: Normal. Skull base and vertebrae: No acute fracture. No primary bone lesion or focal pathologic process. Mineralization along the posterior aspect of the dens as can be seen with a crystalline arthropathy. Soft tissues and spinal canal: No prevertebral fluid or swelling. No visible canal hematoma. Disc levels: Degenerative disease with disc height loss at C4-5 and C5-6. At C3-4 there is a small central disc protrusion. At C4-5 there is left uncovertebral degenerative changes mild left foraminal narrowing. At C5-6 there is a broad-based disc osteophyte complex, bilateral uncovertebral degenerative changes, and bilateral foraminal narrowing. Upper chest: Bilateral pleural effusions. Airspace disease in the left lung apex.  Partially visualized large right pneumothorax. Other: Endotracheal tube with the tip in satisfactory position above the carina. IMPRESSION: 1. No acute intracranial pathology. 2. No acute osseous injury of the cervical spine. 3. Partially visualized large right hydropneumothorax. 4. Bilateral pleural effusions. Airspace disease in the left lung apex which may reflect pulmonary contusion or aspiration. Critical Value/emergent results were called by telephone at the time of interpretation on 07/05/2020 at 7:34 pm to provider [facility] , who verbally acknowledged these results. Electronically Signed   By: Kathreen Devoid   On: 06/21/2020 19:34   CT CERVICAL SPINE WO CONTRAST  Result Date: 06/11/2020 CLINICAL DATA:  Poly trauma, critical head and C-spine injury. EXAM: CT HEAD WITHOUT CONTRAST CT CERVICAL SPINE WITHOUT CONTRAST TECHNIQUE: Multidetector CT imaging of the head and cervical spine was performed following the standard protocol without intravenous contrast. Multiplanar CT image reconstructions of the cervical spine were also generated.  COMPARISON:  None. FINDINGS: CT HEAD FINDINGS Brain: No evidence of acute infarction, hemorrhage, extra-axial collection, ventriculomegaly, or mass effect. Generalized cerebral atrophy. Periventricular white matter low attenuation likely secondary to microangiopathy. Vascular: Cerebrovascular atherosclerotic calcifications are noted. Skull: Negative for fracture or focal lesion. Old posttraumatic deformity of bilateral nasal bones. Sinuses/Orbits: Visualized portions of the orbits are unremarkable. Visualized portions of the paranasal sinuses are unremarkable. Visualized portions of the mastoid air cells are unremarkable. Other: None. CT CERVICAL SPINE FINDINGS Alignment: Normal. Skull base and vertebrae: No acute fracture. No primary bone lesion or focal pathologic process. Mineralization along the posterior aspect of the dens as can be seen with a crystalline  arthropathy. Soft tissues and spinal canal: No prevertebral fluid or swelling. No visible canal hematoma. Disc levels: Degenerative disease with disc height loss at C4-5 and C5-6. At C3-4 there is a small central disc protrusion. At C4-5 there is left uncovertebral degenerative changes mild left foraminal narrowing. At C5-6 there is a broad-based disc osteophyte complex, bilateral uncovertebral degenerative changes, and bilateral foraminal narrowing. Upper chest: Bilateral pleural effusions. Airspace disease in the left lung apex. Partially visualized large right pneumothorax. Other: Endotracheal tube with the tip in satisfactory position above the carina. IMPRESSION: 1. No acute intracranial pathology. 2. No acute osseous injury of the cervical spine. 3. Partially visualized large right hydropneumothorax. 4. Bilateral pleural effusions. Airspace disease in the left lung apex which may reflect pulmonary contusion or aspiration. Critical Value/emergent results were called by telephone at the time of interpretation on 07/03/2020 at 7:34 pm to provider [facility] , who verbally acknowledged these results. Electronically Signed   By: Kathreen Devoid   On: 07/02/2020 19:34   DG Pelvis Portable  Result Date: 06/14/2020 CLINICAL DATA:  Encounter for trauma, fall EXAM: PORTABLE PELVIS 1-2 VIEWS COMPARISON:  None. FINDINGS: There is no evidence of pelvic fracture or diastasis. Vascular calcifications. Catheter tubing overlies the central pelvis. IMPRESSION: No acute fracture or dislocation of the pelvis. Electronically Signed   By: Dahlia Bailiff MD   On: 06/18/2020 19:16   DG Chest Port 1 View  Result Date: 07/04/2020 CLINICAL DATA:  Pneumothorax post chest tube placement EXAM: PORTABLE CHEST 1 VIEW COMPARISON:  Same day CT chest and chest radiograph FINDINGS: Interval placement of a right chest pigtail catheter with decreased size of the now small right pneumothorax. Endotracheal tube with tip overlying the  distal thoracic trachea. Nasogastric tube coursing below the diaphragm with side port overlying the stomach and tip obscured by collimation. The heart size and mediastinal contours appear unchanged. Left hemithorax is obscured by overlying defibrillator pads. Similar bilateral opacities. The visualized skeletal structures are unchanged. IMPRESSION: Interval placement of the right chest pigtail catheter with decreased size of the now small right pneumothorax. Electronically Signed   By: Dahlia Bailiff MD   On: 06/27/2020 21:09   DG Chest Portable 1 View  Result Date: 06/27/2020 CLINICAL DATA:  Post intubation encounter EXAM: PORTABLE CHEST 1 VIEW COMPARISON:  None. FINDINGS: Endotracheal tube with tip overlying the midthoracic trachea. Nasogastric tube coursing below the diaphragm with side port overlying the stomach and tip obscured by collimation. Left IJ central venous catheter with tip oriented in the left subclavian artery. Defibrillator leads overlie the left chest and mediastinum obscuring view. The heart size and mediastinal contours are obscured. Bilateral interstitial and airspace opacities with the left chest predominantly obscured by defibrillator pads. IMPRESSION: 1. Left IJ central venous catheter with tip overlying the left subclavian artery. 2.  Satisfactory positioning of the endotracheal tube and nasogastric tube. 3. Bilateral interstitial and airspace opacities with the left chest predominantly obscured by defibrillator pads. 4. Evaluation of mediastinum is obscured by defibrillator leads and material external to the patient. Electronically Signed   By: Dahlia Bailiff MD   On: 06/17/2020 19:14   CT CHEST ABDOMEN PELVIS WO CONTRAST  Result Date: 07/07/2020 CLINICAL DATA:  Fall down 4 stairs as well as myocardial rest status post CPR EXAM: CT CHEST, ABDOMEN AND PELVIS WITHOUT CONTRAST TECHNIQUE: Multidetector CT imaging of the chest, abdomen and pelvis was performed following the standard  protocol without IV contrast. COMPARISON:  Chest x-ray 07/01/2020, CT abdomen pelvis 07/14/2018 FINDINGS: CHEST: Ports and Devices: Enteric tube with tip and side port terminating within the gastric lumen. Endotracheal tube with tip approximately 1.5 cm above the carina. Urinary bladder Foley catheter terminates within the urinary bladder lumen. Foley is noted to loop within the urinary bladder. Lungs/airways: Bilateral lower lobe consolidations. Peribronchovascular ground-glass airspace opacities within the left upper lobe. No pulmonary mass. No defnite pulmonary contusion or laceration. No pneumatocele formation. The central airways are patent. Pleura: Bilateral trace pleural effusions. Moderate volume right pneumothorax. No hemothorax. Lymph Nodes: No mediastinal, hilar, or axillary lymphadenopathy. Mediastinum: No pneumomediastinum. No aortic injury or mediastinal hematoma. The thoracic aorta is normal in caliber. The heart is normal in size. No significant pericardial effusion. At least mild to moderate three-vessel coronary artery calcifications. The esophagus is unremarkable. The thyroid is unremarkable. Chest Wall / Breasts: No chest wall mass. Musculoskeletal: Minimally displaced acute right fourth and fifth costochondral junction fractures (6:19). Nondisplaced right anterior fourth and fifth rib fractures (3: 23-25). Minimally displaced right anterior sixth rib fracture. Minimally displaced acute anterior left third rib fracture (3:20). Minimally displaced fracture acute of the left fifth, sixth, seventh, eighth (3:20- 59)). Nondisplaced sternal fracture (7:82). Age-indeterminate minimal vertebral body height loss of the T6, T8, and T10 vertebral bodies. ABDOMEN / PELVIS: Liver: Streak artifact limits evaluation of the liver. Not enlarged. No focal lesion. Biliary System: The gallbladder is otherwise unremarkable with no radio-opaque gallstones. No biliary ductal dilatation. Pancreas: Normal pancreatic  contour. No main pancreatic duct dilatation. Spleen: Not enlarged. No focal lesion.   A splenule is noted. Adrenal Glands: No nodularity bilaterally. Kidneys: Nonspecific bilateral trace perinephric stranding. Bilateral kidneys enhance symmetrically. No hydronephrosis. No hydroureter. The urinary bladder is unremarkable. Bowel: No small or large bowel wall thickening or dilatation. No pneumatosis. Mesentery, Omentum, and Peritoneum: No simple free fluid ascites. No pneumoperitoneum. No hemoperitoneum. No mesenteric hematoma identified. No organized fluid collection. Pelvic Organs: Normal. Lymph Nodes: No abdominal, pelvic, inguinal lymphadenopathy. Vasculature: Extensive atherosclerotic plaque. No abdominal aorta or iliac aneurysm. No active contrast extravasation or pseudoaneurysm. Musculoskeletal: No significant soft tissue hematoma. No acute pelvic fracture. No spinal fracture. Multilevel degenerative changes of the spine with grade 1 anterolisthesis of L4 on L5 and multilevel intervertebral disc space vacuum phenomenon. IMPRESSION: 1. Moderate right pneumothorax and bilateral trace pleural effusions. 2. Bilateral peribronchovascular ground-glass airspace opacities and well as bilateral lower lobe consolidations that likely represent aspiration pneumonia. Followup PA and lateral chest X-ray is recommended in 3-4 weeks following therapy to ensure resolution and exclude underlying malignancy. 3. Bilateral minimally displaced rib fractures as well as right anterior third and fourth costochondral junction fractures. 4. Minimally displaced sternal fracture. 5. Otherwise no acute intra-abdominal or intrapelvic abnormality; however, please note limited evaluation due to noncontrast study. 6. Age-indeterminate minimal T6, T8, and T10 compression fractures. Correlate  with tenderness to palpation to evaluate for an acute component. 7.  Aortic Atherosclerosis (ICD10-I70.0) - extensive. 8. Endotracheal tube with tip  approximately 1.5 cm above the carina. Consider retracting by 1 cm. 9. Urinary bladder Foley catheter loops within the urinary bladder. Consider retracting. These results were called by telephone at the time of interpretation on 06/24/2020 at 7:31 pm to provider [facility] , who verbally acknowledged these results. Electronically Signed   By: Iven Finn M.D.   On: 06/17/2020 19:45    Procedures Procedure Name: Intubation Date/Time: 06/11/2020 11:26 PM Performed by: Camila Li, MD Pre-anesthesia Checklist: Suction available, Patient being monitored, Emergency Drugs available, Patient identified and Timeout performed Oxygen Delivery Method: Ambu bag Preoxygenation: Pre-oxygenation with 100% oxygen Induction Type: Rapid sequence Ventilation: Oral airway inserted - appropriate to patient size Laryngoscope Size: Glidescope and 3 Grade View: Grade I Tube size: 7.5 mm Number of attempts: 2 (Mac 3 DL look, anterior airway, switched to glidescope) Placement Confirmation: ETT inserted through vocal cords under direct vision,  Positive ETCO2,  CO2 detector and Breath sounds checked- equal and bilateral Secured at: 23 cm Tube secured with: Tape Difficulty Due To: Difficult Airway- due to anterior larynx       Medications Ordered in ED Medications  propofol (DIPRIVAN) 1000 MG/100ML infusion (20 mcg/kg/min  56.7 kg Intravenous Rate/Dose Change 06/27/2020 2229)  docusate (COLACE) 50 MG/5ML liquid 100 mg (has no administration in time range)  polyethylene glycol (MIRALAX / GLYCOLAX) packet 17 g (has no administration in time range)  insulin aspart (novoLOG) injection 2-6 Units (has no administration in time range)  heparin injection 5,000 Units (5,000 Units Subcutaneous Given 06/30/2020 2319)  0.9 %  sodium chloride infusion (has no administration in time range)  pantoprazole (PROTONIX) injection 40 mg (40 mg Intravenous Given 07/07/2020 2319)  fentaNYL (SUBLIMAZE) injection 25 mcg (has no  administration in time range)  fentaNYL (SUBLIMAZE) injection 25-100 mcg (has no administration in time range)  piperacillin-tazobactam (ZOSYN) IVPB 2.25 g (2.25 g Intravenous New Bag/Given 06/10/2020 2320)  etomidate (AMIDATE) injection (30 mg Intravenous Given 06/18/2020 1812)  rocuronium (ZEMURON) injection (70 mg Intravenous Given 06/12/2020 1813)  etomidate (AMIDATE) injection (30 mg Intravenous Not Given 07/02/2020 1812)  sodium chloride 0.9 % bolus 1,000 mL (0 mLs Intravenous Stopped 06/16/2020 2022)    ED Course  I have reviewed the triage vital signs and the nursing notes.  Pertinent labs & imaging results that were available during my care of the patient were reviewed by me and considered in my medical decision making (see chart for details).    MDM Rules/Calculators/A&P                          This is a 87 year old female with a past medical history of diabetic retinopathy, hypertension, diabetes, left bundle branch block, who presents emergency department status post cardiac arrest.  Per family, the patient been having some indigestion and heartburn throughout the day, was walking on the stairs and had an episode where she collapsed falling approximately 4 stairs.  Patient did not have a pulse and CPR was started, fire arrived and continued CPR, EMS arrived and continued ACLS.  Initial rhythm was PEA arrest, head ACLS CPR for approximately 15 minutes, received 4 rounds of epinephrine, ROSC was obtained with initial return rhythm of sinus tachycardia, patient then had sinus bradycardia into the 30s and required pacing.  She arrives currently being paced, unresponsive, is initiating some of her  own breaths.  Patient was transition to our pacer and we obtained mechanical capture with a palpable femoral pulse, but there is some difficulty with obtaining an IVP.  Patient had equal pulses in the lower extremities that were strong.   EKG was obtained that did show some nonspecific ST changes and T wave  inversions, but no STEMI.  She was given RSI medications and a definitive airway was secured.  After intubation, breath sounds were appreciated on both sides, chest x-ray showed ET tube in correct location with no obvious pneumothorax.  Given the fall CT scan of her head, neck, chest abdomen and pelvis was obtained which did show multiple rib fractures, sternal fracture, as well as a large right-sided hydrothorax, no tension pathology. Pigtail thoracostomy tube placed by critical care intensivist. Surgery consulted for traumatic rib fractures/sternal fracture, likely related to CPR. Patient admitted to ICU, I did speak to her granddaughter and discussed guarded prognosis. Daughter of patient is in [place] and is going to work on traveling to hospital.   Final Clinical Impression(s) / ED Diagnoses Final diagnoses:  Fall  Cardiac arrest [facility])    Rx / DC Orders ED Discharge Orders    None       Camila Li, MD 06/16/2020 2329    Tegeler, Gwenyth Allegra, MD 06/14/20 [phone]

## 2020-06-13 NOTE — H&P (Signed)
NAME:  Tracy Donaldson, MRN:  [id number], DOB:  05-04-1930, LOS: 0 ADMISSION DATE:  07/01/2020, CONSULTATION DATE:  06/12/2020 REFERRING MD:  DR Suzzanne Cloud, CHIEF COMPLAINT:  Acute cardiopulm arrest   Brief History:  87 year old woman with hypertension, diabetes type 2, chronic kidney disease (unknown stage), left bundle branch block.  PEA arrest 2/4, suffered fall down staircase peri-arrest.  CPR greater than 15 minutes.  Resuscitated to sinus bradycardia and required transcutaneous pacing, initiation epinephrine, intubation mechanical ventilation.   History of Present Illness:  87 year old woman with diabetes, hypertension with diastolic dysfunction, hyperlipidemia, left bundle branch block, chronic kidney disease (unknown stage).  She was apparently complaining of indigestion earlier in the day on 2/4.  Then she tripped and fell down 4 stairs and was unresponsive.  Unclear whether she was conscious when she fell or if this was periarrest.  CPR initiated immediately by family, PEA noted on EMS arrival.  ROSC after 15 minutes CPR on epinephrine infusion, 2 sinus tachycardia then bradycardia requiring transcutaneous pacing.  Intubated on arrival to the ED. trauma series CT scans ordered and pending, acute right pneumothorax noted on initial read and chest tube placed in the ED.  Past Medical History:   Past Medical History:  Diagnosis Date  . CHF (congestive heart failure) ([place])   . Chronic kidney disease   . Coronary artery disease   . Hyperlipidemia   . Hypertension     Significant Hospital Events:    Consults:  Cardiology 2/4 Trauma 2/4   Procedures:  ET tube 2/4 >>  Right pigtail chest tube 2/4 >>   Significant Diagnostic Tests:  Head CT 2/4 CT chest abdomen pelvis 2/4 CT cervical spine 2/4  Micro Data:  SARS CoV2 2/4 >> negative  Blood 2/4 >>  Urine 2/4 >> Respiratory culture 2/4 >>   Antimicrobials:  Zosyn 2/4 >>   Interim History / Subjective:    Objective   Blood  pressure (!) 177/61, pulse (!) 109, temperature 98.7 F (37.1 C), temperature source Temporal, resp. rate 12, height 5\' 2"  (1.575 m), weight 56.7 kg, SpO2 94 %.    Vent Mode: PRVC FiO2 (%):  [100 %] 100 % Set Rate:  [20 bmp] 20 bmp Vt Set:  [400 mL] 400 mL PEEP:  [5 cmH20] 5 cmH20 Plateau Pressure:  [22 cmH20] 22 cmH20   Intake/Output Summary (Last 24 hours) at 06/12/2020 1955 Last data filed at 07/03/2020 1847 Gross per 24 hour  Intake 19.03 ml  Output 200 ml  Net -180.97 ml   Filed Weights   07/04/2020 1835  Weight: 56.7 kg    Examination: General: Ill-appearing elderly woman, intubated HENT: ET tube in place, oropharynx otherwise clear, pupils equal, sluggishly react Lungs: Decreased breath sounds on the right, coarse bilaterally Cardiovascular: Regular, tachy 110, distant, no murmur Abdomen: Nondistended, hypoactive bowel sounds Extremities: Trace bilateral lower extremity edema Neuro: Unresponsive (propofol 15) to pain or any stimulation, pupils equal, sluggishly react  Addendum: On follow-up exam patient beginning to show some generalized intermittent myoclonic jerks.  Resolved Hospital Problem list     Assessment & Plan:  Acute PEA arrest, cause unclear.  Resuscitated to sinus bradycardia and required temporary pacing.   -Appreciate cardiology evaluation.  Possible ST T wave changes inferior laterally, baseline left bundle branch block.  No acute intervention recommended at this point -No longer requiring pacing -Plan for normothermia 36C TTM  Shock, presumed cardiogenic but consider septic shock, hypovolemic shock.  Differential diagnosis broad at this point -Epinephrine  drip >> now weaned to off -Given possible sepsis plan for empiric Zosyn pending culture data.  Low threshold to wean off if no evidence for infection or other cause for her arrest and shock identified -Follow serial troponin -Follow echocardiogram  Acute respiratory failure with hypoxemia -With  bilateral interstitial infiltrates on chest x-ray, CT chest.  Goal would be PRVC 6 cc/kg.  Given her profound metabolic acidosis will continue her current 8 cc/kg, maintain minute ventilation.  We will decrease her tidal volumes when possible to do so. -Pulmonary hygiene -VAP prevention orders  Acute right pneumothorax.  Presumed traumatic from CPR, rib fractures -Right pigtail chest tube placed and to suction -20 cmH2O -Follow chest x-ray  Acute on chronic (stage not known) renal failure -Follow urine output, BMP with restoration adequate perfusion pressures -Hold off on aggressive IV fluid resuscitation at this time given interstitial infiltrates on chest x-ray and adequate blood pressure currently  Anion gap metabolic acidosis, lactic acidosis -Follow serial lactic acid for clearance  Hyperkalemia -Follow BMP  Hyponatremia -Corrects to 131, follow with serial BMP  Diabetes mellitus with hyperglycemia -ICU hyperglycemia protocol, sliding scale insulin  Acute toxic metabolic encephalopathy, high risk hypoxemic encephalopathy showing some signs of myoclonus -Head CT has been performed, results pending -Plan for EEG -We will probably need to obtain neurology evaluation for prognostication depending on course.   Best practice (evaluated daily)  Diet: NPO Pain/Anxiety/Delirium protocol (if indicated): Propofol, fentanyl as needed VAP protocol (if indicated): 2/4 DVT prophylaxis: Heparin subcu GI prophylaxis: PPI Glucose control: ICU hyperglycemia protocol Mobility: BR Disposition: ICU  Goals of Care:  Last date of multidisciplinary goals of care discussion 2/4 Family and staff present: Dr Chapman Fitch NP. Discussed with patient's daughter by phone.  Summary of discussion: All aggressive medical care, no CPR, defib, pressors OK Follow up goals of care discussion due: 2/11 Code Status: partial code, no chest compressions  Labs   CBC: Recent Labs  Lab 06/21/2020 1806  06/22/2020 1839  WBC 23.2*  --   HGB 10.7* 10.9*  HCT 33.1* 32.0*  MCV 92.7  --   PLT 268  --     Basic Metabolic Panel: Recent Labs  Lab 06/30/2020 1806 06/17/2020 1839  NA 122* 121*  K 5.5* 5.4*  CL 92* 92*  CO2 10*  --   GLUCOSE 466* 457*  BUN 61* 67*  CREATININE 2.00* 1.80*  CALCIUM 8.0*  --   MG 1.9  --    GFR: Estimated Creatinine Clearance: 17.1 mL/min (A) (by C-G formula based on SCr of 1.8 mg/dL (H)). Recent Labs  Lab 07/01/2020 1806 06/10/2020 1827  WBC 23.2*  --   LATICACIDVEN  --  >11.0*    Liver Function Tests: No results for input(s): AST, ALT, ALKPHOS, BILITOT, PROT, ALBUMIN in the last 168 hours. No results for input(s): LIPASE, AMYLASE in the last 168 hours. No results for input(s): AMMONIA in the last 168 hours.  ABG    Component Value Date/Time   TCO2 15 (L) 06/10/2020 1839     Coagulation Profile: No results for input(s): INR, PROTIME in the last 168 hours.  Cardiac Enzymes: No results for input(s): CKTOTAL, CKMB, CKMBINDEX, TROPONINI in the last 168 hours.  HbA1C: No results found for: HGBA1C  CBG: No results for input(s): GLUCAP in the last 168 hours.  Review of Systems:   Unable to obtain  Past Medical History:  She,  has a past medical history of CHF (congestive heart failure) ([place]), Chronic  kidney disease, Coronary artery disease, Hyperlipidemia, and Hypertension.   Surgical History:   Past Surgical History:  Procedure Laterality Date  . APPENDECTOMY    . CATARACT EXTRACTION W/PHACO Right 12/15/2006   unknown  . CATARACT EXTRACTION W/PHACO Left 05/18/2007   unknown  . EYE SURGERY Left    TPPV  . HYSTEROTOMY    . TONSILLECTOMY       Social History:   reports that she has never smoked. She has never used smokeless tobacco. She reports current alcohol use of about 1.0 standard drink of alcohol per week. She reports that she does not use drugs.   Family History:  Her family history includes Diabetes in her brother, father,  mother, and sister; Hypertension in her father and mother.   Allergies Allergies  Allergen Reactions  . Codeine Hives     Home Medications  Prior to Admission medications   Medication Sig Start Date End Date Taking? Authorizing Provider  amLODipine (NORVASC) 10 MG tablet Take 5 mg by mouth.     [provider]  amLODipine (NORVASC) 5 MG tablet Take 5 mg by mouth daily. 06/16/19   [provider]  DULoxetine (CYMBALTA) 20 MG capsule Take 20 mg by mouth daily. 07/21/19   [provider]  glipiZIDE (GLUCOTROL XL) 2.5 MG 24 hr tablet Take 5 mg by mouth daily. 07/19/18   [provider]  glipiZIDE (GLUCOTROL) 5 MG tablet Take 5 mg by mouth 2 (two) times daily. 06/23/19   [provider]  glucose blood (ONE TOUCH ULTRA TEST) test strip CHECK CBG ONCE A DAY IN VITRO 90 DAYS 06/27/18   [provider]  glucose blood test strip CHECK CBG ONCE A DAY IN VITRO 90 08/29/18   [provider]  losartan (COZAAR) 100 MG tablet Take 100 mg by mouth daily. 08/15/19   [provider]  losartan-hydrochlorothiazide (HYZAAR) 100-12.5 MG tablet Take 1 tablet by mouth daily. 07/19/18   [provider]  losartan-hydrochlorothiazide (HYZAAR) 50-12.5 MG tablet Take by mouth.    [provider]  metoprolol succinate (TOPROL-XL) 50 MG 24 hr tablet Take 1 tablet by mouth daily. 07/19/18   [provider]  metoprolol tartrate (LOPRESSOR) 50 MG tablet Take by mouth.    [provider]  ondansetron (ZOFRAN) 8 MG tablet Take by mouth at bedtime. 02/20/20   [provider]  simvastatin (ZOCOR) 5 MG tablet Take 1 tablet by mouth daily. 09/04/18   [provider]  sitaGLIPtin (JANUVIA) 25 MG tablet Take 1 tablet by mouth daily. Patient not taking: Reported on 03/06/2020    [provider]  TRESIBA FLEXTOUCH 100 UNIT/ML FlexTouch Pen Inject into the skin. 02/20/20   [provider]  tretinoin  (RETIN-A) 0.05 % cream APPLY ONCE TOPICALLY TO FACE AT BEDTIME 04/23/19   [provider]     Critical care time: 7 minutes     [name], MD, PhD 06/17/2020, 9:10 PM [facility] [phone] or if no answer before 7:00PM call [phone] For any issues after 7:00PM please call eLink [phone]

## 2020-06-13 NOTE — Consult Note (Addendum)
Reason for Consult: Fall from stairs Referring Physician: Dr. Jerrye Beavers is an 87 y.o. female.  HPI: Patient is an 87 year old female, who arrived status post fall and post fall arrest with CPR. Per report patient had fallen, received CPR from patient's family, and then received CPR from EMS.  This was approximately 15+ minutes total.  Patient was brought to the ER and underwent work-up.  Upon work-up in the ER patient was found to have a right-sided pneumothorax and multiple rib fractures.  There were no further intra-abdominal or chest injuries.  Patient does have history of CHF, CAD, hyperlipidemia, hypertension.  Patient underwent right sided pigtail placement by CCM.  Past Medical History:  Diagnosis Date  . CHF (congestive heart failure) ([place])   . Chronic kidney disease   . Coronary artery disease   . Hyperlipidemia   . Hypertension     Past Surgical History:  Procedure Laterality Date  . APPENDECTOMY    . CATARACT EXTRACTION W/PHACO Right 12/15/2006   unknown  . CATARACT EXTRACTION W/PHACO Left 05/18/2007   unknown  . EYE SURGERY Left    TPPV  . HYSTEROTOMY    . TONSILLECTOMY      Family History  Problem Relation Age of Onset  . Diabetes Mother   . Hypertension Mother   . Diabetes Father   . Hypertension Father   . Diabetes Sister   . Diabetes Brother     Social History:  reports that she has never smoked. She has never used smokeless tobacco. She reports current alcohol use of about 1.0 standard drink of alcohol per week. She reports that she does not use drugs.  Allergies:  Allergies  Allergen Reactions  . Codeine Hives    Medications: I have reviewed the patient's current medications.  Results for orders placed or performed during the hospital encounter of 06/14/2020 (from the past 48 hour(s))  Basic metabolic panel     Status: Abnormal   Collection Time: 06/11/2020  6:06 PM  Result Value Ref Range   Sodium 122 (L) 135 - 145 mmol/L   Potassium  5.5 (H) 3.5 - 5.1 mmol/L   Chloride 92 (L) 98 - 111 mmol/L   CO2 10 (L) 22 - 32 mmol/L   Glucose, Bld 466 (H) 70 - 99 mg/dL    Comment: Glucose reference range applies only to samples taken after fasting for at least 8 hours.   BUN 61 (H) 8 - 23 mg/dL   Creatinine, Ser 2.00 (H) 0.44 - 1.00 mg/dL   Calcium 8.0 (L) 8.9 - 10.3 mg/dL   GFR, Estimated 24 (L) >60 mL/min    Comment: (NOTE) Calculated using the CKD-EPI Creatinine Equation (2021)    Anion gap 20 (H) 5 - 15    Comment: Performed at [facility] [address], [place], [place] [address]  CBC     Status: Abnormal   Collection Time: 06/16/2020  6:06 PM  Result Value Ref Range   WBC 23.2 (H) 4.0 - 10.5 K/uL   RBC 3.57 (L) 3.87 - 5.11 MIL/uL   Hemoglobin 10.7 (L) 12.0 - 15.0 g/dL   HCT 33.1 (L) 36.0 - 46.0 %   MCV 92.7 80.0 - 100.0 fL   MCH 30.0 26.0 - 34.0 pg   MCHC 32.3 30.0 - 36.0 g/dL   RDW 12.6 11.5 - 15.5 %   Platelets 268 150 - 400 K/uL   nRBC 0.0 0.0 - 0.2 %    Comment: Performed  at [facility], [place] [address], [place], [place] [address]  Troponin I (High Sensitivity)     Status: Abnormal   Collection Time: 06/16/2020  6:06 PM  Result Value Ref Range   Troponin I (High Sensitivity) 396 (HH) <18 ng/L    Comment: CRITICAL RESULT CALLED TO, READ BACK BY AND VERIFIED WITH: B.MICHAELSON RN 1904 06/11/2020 MCCORMICK K (NOTE) Elevated high sensitivity troponin I (hsTnI) values and significant  changes across serial measurements may suggest ACS but many other  chronic and acute conditions are known to elevate hsTnI results.  Refer to the Links section for chest pain algorithms and additional  guidance. Performed at [facility], [place] [address], [place], [place] [address]   Magnesium     Status: None   Collection Time: 06/14/2020  6:06 PM  Result Value Ref Range   Magnesium 1.9 1.7 - 2.4 mg/dL    Comment: Performed at [facility] [address] [place], [place] [address]  SARS Coronavirus 2 by RT  PCR (hospital order, performed in [facility] hospital lab) Nasopharyngeal Nasopharyngeal Swab     Status: None   Collection Time: 06/29/2020  6:09 PM   Specimen: Nasopharyngeal Swab  Result Value Ref Range   SARS Coronavirus 2 NEGATIVE NEGATIVE    Comment: (NOTE) SARS-CoV-2 target nucleic acids are NOT DETECTED.  The SARS-CoV-2 RNA is generally detectable in upper and lower respiratory specimens during the acute phase of infection. The lowest concentration of SARS-CoV-2 viral copies this assay can detect is 250 copies / mL. A negative result does not preclude SARS-CoV-2 infection and should not be used as the sole basis for treatment or other patient management decisions.  A negative result may occur with improper specimen collection / handling, submission of specimen other than nasopharyngeal swab, presence of viral mutation(s) within the areas targeted by this assay, and inadequate number of viral copies (<250 copies / mL). A negative result must be combined with clinical observations, patient history, and epidemiological information.  Fact Sheet for Patients:   StrictlyIdeas.no  Fact Sheet for Healthcare Providers: BankingDealers.co.za  This test is not yet approved or  cleared by the [place] FDA and has been authorized for detection and/or diagnosis of SARS-CoV-2 by FDA under an Emergency Use Authorization (EUA).  This EUA will remain in effect (meaning this test can be used) for the duration of the COVID-19 declaration under Section 564(b)(1) of the Act, 21 U.S.C. section 360bbb-3(b)(1), unless the authorization is terminated or revoked sooner.  Performed at [facility], [place] [address] [place] [address], [place], [place] [address]   Lactic acid, plasma     Status: Abnormal   Collection Time: 06/20/2020  6:27 PM  Result Value Ref Range   Lactic Acid, Venous >11.0 (HH) 0.5 - 1.9 mmol/L    Comment: CRITICAL RESULT CALLED TO, READ BACK  BY AND VERIFIED WITH: B.MICHAELSON RN 1905 06/30/2020 MCCORMICK K Performed at [facility] [address] [place], [place] [address]   Ethanol     Status: None   Collection Time: 06/14/2020  6:27 PM  Result Value Ref Range   Alcohol, Ethyl (B) <10 <10 mg/dL    Comment: (NOTE) Lowest detectable limit for serum alcohol is 10 mg/dL.  For medical purposes only. Performed at [facility], [place] [address] [place],  [address]   Urinalysis, Routine w reflex microscopic     Status: Abnormal   Collection Time: 06/27/2020  6:27 PM  Result Value Ref Range   Color, Urine AMBER (A) YELLOW    Comment: BIOCHEMICALS MAY BE AFFECTED BY COLOR   APPearance CLOUDY (A) CLEAR   Specific Gravity, Urine 1.014 1.005 - 1.030   pH 5.0 5.0 - 8.0   Glucose, UA >=500 (A) NEGATIVE mg/dL   Hgb urine dipstick SMALL (A) NEGATIVE   Bilirubin Urine NEGATIVE NEGATIVE   Ketones, ur NEGATIVE NEGATIVE mg/dL   Protein, ur >=300 (A) NEGATIVE mg/dL   Nitrite NEGATIVE NEGATIVE   Leukocytes,Ua NEGATIVE NEGATIVE   RBC / HPF 11-20 0 - 5 RBC/hpf   WBC, UA 0-5 0 - 5 WBC/hpf   Bacteria, UA FEW (A) NONE SEEN   Squamous Epithelial / LPF 0-5 0 - 5   Mucus PRESENT    Budding Yeast PRESENT    Non Squamous Epithelial 0-5 (A) NONE SEEN    Comment: Performed at [facility], [address], [place], [place] [address]  Protime-INR     Status: Abnormal   Collection Time: 06/27/2020  6:27 PM  Result Value Ref Range   Prothrombin Time 15.4 (H) 11.4 - 15.2 seconds   INR 1.3 (H) 0.8 - 1.2    Comment: (NOTE) INR goal varies based on device and disease states. Performed at [facility], [place] [address], [place], [place] [address]   I-stat chem 8, ED (not at [facility] or [facility])     Status: Abnormal   Collection Time: 06/16/2020  6:39 PM  Result Value Ref Range   Sodium 121 (L) 135 - 145 mmol/L   Potassium 5.4 (H) 3.5 - 5.1 mmol/L   Chloride 92 (L) 98 - 111 mmol/L   BUN 67 (H) 8 - 23 mg/dL   Creatinine, Ser 1.80  (H) 0.44 - 1.00 mg/dL   Glucose, Bld 457 (H) 70 - 99 mg/dL    Comment: Glucose reference range applies only to samples taken after fasting for at least 8 hours.   Calcium, Ion 1.03 (L) 1.15 - 1.40 mmol/L   TCO2 15 (L) 22 - 32 mmol/L   Hemoglobin 10.9 (L) 12.0 - 15.0 g/dL   HCT 32.0 (L) 36.0 - 46.0 %  I-Stat arterial blood gas, ED     Status: Abnormal   Collection Time: 06/16/2020  8:28 PM  Result Value Ref Range   pH, Arterial 7.147 (LL) 7.350 - 7.450   pCO2 arterial 46.5 32.0 - 48.0 mmHg   pO2, Arterial 99 83.0 - 108.0 mmHg   Bicarbonate 16.1 (L) 20.0 - 28.0 mmol/L   TCO2 17 (L) 22 - 32 mmol/L   O2 Saturation 95.0 %   Acid-base deficit 12.0 (H) 0.0 - 2.0 mmol/L   Sodium 122 (L) 135 - 145 mmol/L   Potassium 5.9 (H) 3.5 - 5.1 mmol/L   Calcium, Ion 1.11 (L) 1.15 - 1.40 mmol/L   HCT 33.0 (L) 36.0 - 46.0 %   Hemoglobin 11.2 (L) 12.0 - 15.0 g/dL   Patient temperature 98.7 F    Collection site Brachial    Drawn by RT    Sample type ARTERIAL    Comment NOTIFIED PHYSICIAN     CT HEAD WO CONTRAST  Result Date: 06/30/2020 CLINICAL DATA:  Poly trauma, critical head and C-spine injury. EXAM: CT HEAD WITHOUT CONTRAST CT CERVICAL SPINE WITHOUT CONTRAST TECHNIQUE: Multidetector CT imaging of the head and cervical spine was performed following the standard protocol without intravenous contrast. Multiplanar CT image reconstructions of the cervical spine were also generated. COMPARISON:  None.  FINDINGS: CT HEAD FINDINGS Brain: No evidence of acute infarction, hemorrhage, extra-axial collection, ventriculomegaly, or mass effect. Generalized cerebral atrophy. Periventricular white matter low attenuation likely secondary to microangiopathy. Vascular: Cerebrovascular atherosclerotic calcifications are noted. Skull: Negative for fracture or focal lesion. Old posttraumatic deformity of bilateral nasal bones. Sinuses/Orbits: Visualized portions of the orbits are unremarkable. Visualized portions of the paranasal  sinuses are unremarkable. Visualized portions of the mastoid air cells are unremarkable. Other: None. CT CERVICAL SPINE FINDINGS Alignment: Normal. Skull base and vertebrae: No acute fracture. No primary bone lesion or focal pathologic process. Mineralization along the posterior aspect of the dens as can be seen with a crystalline arthropathy. Soft tissues and spinal canal: No prevertebral fluid or swelling. No visible canal hematoma. Disc levels: Degenerative disease with disc height loss at C4-5 and C5-6. At C3-4 there is a small central disc protrusion. At C4-5 there is left uncovertebral degenerative changes mild left foraminal narrowing. At C5-6 there is a broad-based disc osteophyte complex, bilateral uncovertebral degenerative changes, and bilateral foraminal narrowing. Upper chest: Bilateral pleural effusions. Airspace disease in the left lung apex. Partially visualized large right pneumothorax. Other: Endotracheal tube with the tip in satisfactory position above the carina. IMPRESSION: 1. No acute intracranial pathology. 2. No acute osseous injury of the cervical spine. 3. Partially visualized large right hydropneumothorax. 4. Bilateral pleural effusions. Airspace disease in the left lung apex which may reflect pulmonary contusion or aspiration. Critical Value/emergent results were called by telephone at the time of interpretation on 07/03/2020 at 7:34 pm to provider [facility] , who verbally acknowledged these results. Electronically Signed   By: Kathreen Devoid   On: 06/26/2020 19:34   CT CERVICAL SPINE WO CONTRAST  Result Date: 06/18/2020 CLINICAL DATA:  Poly trauma, critical head and C-spine injury. EXAM: CT HEAD WITHOUT CONTRAST CT CERVICAL SPINE WITHOUT CONTRAST TECHNIQUE: Multidetector CT imaging of the head and cervical spine was performed following the standard protocol without intravenous contrast. Multiplanar CT image reconstructions of the cervical spine were also generated. COMPARISON:   None. FINDINGS: CT HEAD FINDINGS Brain: No evidence of acute infarction, hemorrhage, extra-axial collection, ventriculomegaly, or mass effect. Generalized cerebral atrophy. Periventricular white matter low attenuation likely secondary to microangiopathy. Vascular: Cerebrovascular atherosclerotic calcifications are noted. Skull: Negative for fracture or focal lesion. Old posttraumatic deformity of bilateral nasal bones. Sinuses/Orbits: Visualized portions of the orbits are unremarkable. Visualized portions of the paranasal sinuses are unremarkable. Visualized portions of the mastoid air cells are unremarkable. Other: None. CT CERVICAL SPINE FINDINGS Alignment: Normal. Skull base and vertebrae: No acute fracture. No primary bone lesion or focal pathologic process. Mineralization along the posterior aspect of the dens as can be seen with a crystalline arthropathy. Soft tissues and spinal canal: No prevertebral fluid or swelling. No visible canal hematoma. Disc levels: Degenerative disease with disc height loss at C4-5 and C5-6. At C3-4 there is a small central disc protrusion. At C4-5 there is left uncovertebral degenerative changes mild left foraminal narrowing. At C5-6 there is a broad-based disc osteophyte complex, bilateral uncovertebral degenerative changes, and bilateral foraminal narrowing. Upper chest: Bilateral pleural effusions. Airspace disease in the left lung apex. Partially visualized large right pneumothorax. Other: Endotracheal tube with the tip in satisfactory position above the carina. IMPRESSION: 1. No acute intracranial pathology. 2. No acute osseous injury of the cervical spine. 3. Partially visualized large right hydropneumothorax. 4. Bilateral pleural effusions. Airspace disease in the left lung apex which may reflect pulmonary contusion or aspiration. Critical Value/emergent results were  called by telephone at the time of interpretation on 07/07/2020 at 7:34 pm to provider [facility] ,  who verbally acknowledged these results. Electronically Signed   By: Kathreen Devoid   On: 06/18/2020 19:34   DG Pelvis Portable  Result Date: 06/11/2020 CLINICAL DATA:  Encounter for trauma, fall EXAM: PORTABLE PELVIS 1-2 VIEWS COMPARISON:  None. FINDINGS: There is no evidence of pelvic fracture or diastasis. Vascular calcifications. Catheter tubing overlies the central pelvis. IMPRESSION: No acute fracture or dislocation of the pelvis. Electronically Signed   By: Dahlia Bailiff MD   On: 07/07/2020 19:16   DG Chest Portable 1 View  Result Date: 06/24/2020 CLINICAL DATA:  Post intubation encounter EXAM: PORTABLE CHEST 1 VIEW COMPARISON:  None. FINDINGS: Endotracheal tube with tip overlying the midthoracic trachea. Nasogastric tube coursing below the diaphragm with side port overlying the stomach and tip obscured by collimation. Left IJ central venous catheter with tip oriented in the left subclavian artery. Defibrillator leads overlie the left chest and mediastinum obscuring view. The heart size and mediastinal contours are obscured. Bilateral interstitial and airspace opacities with the left chest predominantly obscured by defibrillator pads. IMPRESSION: 1. Left IJ central venous catheter with tip overlying the left subclavian artery. 2. Satisfactory positioning of the endotracheal tube and nasogastric tube. 3. Bilateral interstitial and airspace opacities with the left chest predominantly obscured by defibrillator pads. 4. Evaluation of mediastinum is obscured by defibrillator leads and material external to the patient. Electronically Signed   By: Dahlia Bailiff MD   On: 06/25/2020 19:14   CT CHEST ABDOMEN PELVIS WO CONTRAST  Result Date: 06/22/2020 CLINICAL DATA:  Fall down 4 stairs as well as myocardial rest status post CPR EXAM: CT CHEST, ABDOMEN AND PELVIS WITHOUT CONTRAST TECHNIQUE: Multidetector CT imaging of the chest, abdomen and pelvis was performed following the standard protocol without IV  contrast. COMPARISON:  Chest x-ray 06/14/2020, CT abdomen pelvis 07/14/2018 FINDINGS: CHEST: Ports and Devices: Enteric tube with tip and side port terminating within the gastric lumen. Endotracheal tube with tip approximately 1.5 cm above the carina. Urinary bladder Foley catheter terminates within the urinary bladder lumen. Foley is noted to loop within the urinary bladder. Lungs/airways: Bilateral lower lobe consolidations. Peribronchovascular ground-glass airspace opacities within the left upper lobe. No pulmonary mass. No defnite pulmonary contusion or laceration. No pneumatocele formation. The central airways are patent. Pleura: Bilateral trace pleural effusions. Moderate volume right pneumothorax. No hemothorax. Lymph Nodes: No mediastinal, hilar, or axillary lymphadenopathy. Mediastinum: No pneumomediastinum. No aortic injury or mediastinal hematoma. The thoracic aorta is normal in caliber. The heart is normal in size. No significant pericardial effusion. At least mild to moderate three-vessel coronary artery calcifications. The esophagus is unremarkable. The thyroid is unremarkable. Chest Wall / Breasts: No chest wall mass. Musculoskeletal: Minimally displaced acute right fourth and fifth costochondral junction fractures (6:19). Nondisplaced right anterior fourth and fifth rib fractures (3: 23-25). Minimally displaced right anterior sixth rib fracture. Minimally displaced acute anterior left third rib fracture (3:20). Minimally displaced fracture acute of the left fifth, sixth, seventh, eighth (3:20- 59)). Nondisplaced sternal fracture (7:82). Age-indeterminate minimal vertebral body height loss of the T6, T8, and T10 vertebral bodies. ABDOMEN / PELVIS: Liver: Streak artifact limits evaluation of the liver. Not enlarged. No focal lesion. Biliary System: The gallbladder is otherwise unremarkable with no radio-opaque gallstones. No biliary ductal dilatation. Pancreas: Normal pancreatic contour. No main  pancreatic duct dilatation. Spleen: Not enlarged. No focal lesion.   A splenule is noted. Adrenal  Glands: No nodularity bilaterally. Kidneys: Nonspecific bilateral trace perinephric stranding. Bilateral kidneys enhance symmetrically. No hydronephrosis. No hydroureter. The urinary bladder is unremarkable. Bowel: No small or large bowel wall thickening or dilatation. No pneumatosis. Mesentery, Omentum, and Peritoneum: No simple free fluid ascites. No pneumoperitoneum. No hemoperitoneum. No mesenteric hematoma identified. No organized fluid collection. Pelvic Organs: Normal. Lymph Nodes: No abdominal, pelvic, inguinal lymphadenopathy. Vasculature: Extensive atherosclerotic plaque. No abdominal aorta or iliac aneurysm. No active contrast extravasation or pseudoaneurysm. Musculoskeletal: No significant soft tissue hematoma. No acute pelvic fracture. No spinal fracture. Multilevel degenerative changes of the spine with grade 1 anterolisthesis of L4 on L5 and multilevel intervertebral disc space vacuum phenomenon. IMPRESSION: 1. Moderate right pneumothorax and bilateral trace pleural effusions. 2. Bilateral peribronchovascular ground-glass airspace opacities and well as bilateral lower lobe consolidations that likely represent aspiration pneumonia. Followup PA and lateral chest X-ray is recommended in 3-4 weeks following therapy to ensure resolution and exclude underlying malignancy. 3. Bilateral minimally displaced rib fractures as well as right anterior third and fourth costochondral junction fractures. 4. Minimally displaced sternal fracture. 5. Otherwise no acute intra-abdominal or intrapelvic abnormality; however, please note limited evaluation due to noncontrast study. 6. Age-indeterminate minimal T6, T8, and T10 compression fractures. Correlate with tenderness to palpation to evaluate for an acute component. 7.  Aortic Atherosclerosis (ICD10-I70.0) - extensive. 8. Endotracheal tube with tip approximately 1.5 cm  above the carina. Consider retracting by 1 cm. 9. Urinary bladder Foley catheter loops within the urinary bladder. Consider retracting. These results were called by telephone at the time of interpretation on 06/14/2020 at 7:31 pm to provider [facility] , who verbally acknowledged these results. Electronically Signed   By: Iven Finn M.D.   On: 06/12/2020 19:45    Review of Systems  HENT: Negative for ear discharge, ear pain, hearing loss and tinnitus.   Eyes: Negative for photophobia and pain.  Respiratory: Negative for cough and shortness of breath.   Cardiovascular: Negative for chest pain.  Gastrointestinal: Negative for abdominal pain, nausea and vomiting.  Genitourinary: Negative for dysuria, flank pain, frequency and urgency.  Musculoskeletal: Negative for back pain, myalgias and neck pain.  Neurological: Negative for dizziness and headaches.  Hematological: Does not bruise/bleed easily.  Psychiatric/Behavioral: The patient is not nervous/anxious.    Blood pressure (!) 160/68, pulse (!) 103, temperature 98.7 F (37.1 C), temperature source Temporal, resp. rate 20, height 5\' 3"  (1.6 m), weight 56.7 kg, SpO2 99 %. Physical Exam Vitals reviewed.  Constitutional:      General: She is not in acute distress.    Appearance: Normal appearance. She is well-developed and well-nourished. She is not diaphoretic.     Interventions: Cervical collar and nasal cannula in place.     Comments: Sedated and intubated   HENT:     Head: Normocephalic and atraumatic. No raccoon eyes, Battle's sign, abrasion, contusion or laceration.     Right Ear: Hearing, tympanic membrane, ear canal and external ear normal. No laceration, drainage or tenderness. No foreign body. No hemotympanum. Tympanic membrane is not perforated.     Left Ear: Hearing, tympanic membrane, ear canal and external ear normal. No laceration, drainage or tenderness. No foreign body. No hemotympanum. Tympanic membrane is not  perforated.     Nose: Nose normal. No nasal deformity, laceration, sinus tenderness, nasal septal hematoma or epistaxis.     Mouth/Throat:     Mouth: Oropharynx is clear and moist and mucous membranes are normal. No lacerations.  Pharynx: Uvula midline.  Eyes:     General: Lids are normal. No scleral icterus.    Extraocular Movements: EOM normal.     Conjunctiva/sclera: Conjunctivae normal.     Pupils: Pupils are equal, round, and reactive to light.  Neck:     Thyroid: No thyromegaly.     Vascular: No carotid bruit or JVD.     Trachea: Trachea normal.  Cardiovascular:     Rate and Rhythm: Normal rate and regular rhythm.     Pulses: Normal pulses and intact distal pulses.     Heart sounds: Normal heart sounds.  Pulmonary:     Effort: Pulmonary effort is normal. No respiratory distress.     Breath sounds: Normal breath sounds.     Comments: R chest tube in place  Chest:     Chest wall: No lacerations, tenderness, crepitus or bony tenderness.  Abdominal:     General: Bowel sounds are decreased. There is no distension.     Palpations: Abdomen is soft. Abdomen is not rigid.     Tenderness: There is no abdominal tenderness. There is no CVA tenderness, guarding or rebound.  Musculoskeletal:        General: No tenderness or edema. Normal range of motion.     Cervical back: No spinous process tenderness or muscular tenderness.  Lymphadenopathy:     Cervical: No cervical adenopathy.  Skin:    General: Skin is warm, dry and intact.  Neurological:     GCS: GCS eye subscore is 1. GCS verbal subscore is 1. GCS motor subscore is 1.     Cranial Nerves: No cranial nerve deficit.     Sensory: No sensory deficit.     Deep Tendon Reflexes: Strength normal.     Comments: Sedated   Psychiatric:        Mood and Affect: Mood and affect normal.        Speech: Speech normal.        Behavior: Behavior normal. Behavior is cooperative.     Assessment/Plan: 87 year old female status post  fall Cardiac arrest Right 4 through 6 rib fractures, left 3, 5 through 8 rib fractures  1.  Discussed with Dr. Lamonte Sakai patient's condition and management of left chest tube.  He states that they are comfortable managing the chest tube. 2.  Patient to require pain control for her rib fractures.  Patient currently intubated.  Rib fractures secondary to CPR most likely. 3.  Patient with no other acute injuries.   Ralene Ok 06/30/2020, 9:08 PM

## 2020-06-13 NOTE — ED Notes (Signed)
Pt switched to ED paced pads before EMS pads removed.  When paused, pt maintaining HR.  Pacer stopped.

## 2020-06-13 NOTE — Procedures (Signed)
Insertion of Chest Tube Procedure Note  Tracy Donaldson  [id number]  1929/11/11  Date:06/14/2020  Time:8:21 PM    Provider Performing: Collene Gobble   Procedure: Chest Tube Insertion [phone])  Indication(s) Pneumothorax  Consent Unable to obtain consent due to emergent nature of procedure.  Anesthesia Topical only with 1% lidocaine    Time Out Verified patient identification, verified procedure, site/side was marked, verified correct patient position, special equipment/implants available, medications/allergies/relevant history reviewed, required imaging and test results available.   Sterile Technique Maximal sterile technique including full sterile barrier drape, hand hygiene, sterile gown, sterile gloves, mask, hair covering, sterile ultrasound probe cover (if used).   Procedure Description Ultrasound not used to identify appropriate pleural anatomy for placement and overlying skin marked. Area of placement cleaned and draped in sterile fashion.  A 14 French pigtail pleural catheter was placed into the right pleural space using Seldinger technique. Appropriate return of air was obtained.  The tube was connected to atrium and placed on -20 cm H2O wall suction.   Complications/Tolerance None; patient tolerated the procedure well. Chest X-ray is ordered to verify placement.   EBL Minimal  Specimen(s) none    CXR pending  [name], MD, PhD 06/21/2020, 8:22 PM [facility] [phone] or if no answer before 7:00PM call [phone] For any issues after 7:00PM please call eLink [phone]

## 2020-06-13 NOTE — ED Notes (Signed)
[phone] Renee daughter would like an update

## 2020-06-13 NOTE — ED Triage Notes (Signed)
Pt here from home via [facility]. Pt had stated to family that she had heartburn earlier that day. This evening she had a witnessed collapse on the stairs and fell down 4 stairs and became unresponsive.  When [facility] arrived pt was in PEA at 60.  Given 3 epi's w/CPR.  ROSC after 15 min with a HR of 140 ST, which then dropped to 35.  Pt arrived with a paced rhythm on an epi drip at 4 mcg per min, pulses present.

## 2020-06-13 NOTE — ED Notes (Signed)
[facility] [phone]

## 2020-06-13 NOTE — ED Notes (Signed)
To ct

## 2020-06-13 NOTE — Progress Notes (Signed)
Pharmacy Antibiotic Note  Tracy Donaldson is a 87 y.o. female admitted on 06/14/2020 s/p cardiac arrest and was subsequently intubated.  Pharmacy has been consulted for Zosyn dosing for ?sepsis.    WBC 23.2, lactate >11, Tm 98.7 (targeting normothermia per CCM).  Will initiate reduced dose Zosyn given unstable renal function- with hx of chronic kidney disease and advanced age, renal function is likely worse than reported.  Will continue to watch and adjust Zosyn dose as needed.   Plan: Zosyn 2.25g IV q8h  Monitor renal function for dose adj F/u blood and urine cultures  Height: 5\' 3"  (160 cm) (measured x 3) Weight: 56.7 kg (125 lb) IBW/kg (Calculated) : 52.4  Temp (24hrs), Avg:98.7 F (37.1 C), Min:98.7 F (37.1 C), Max:98.7 F (37.1 C)  Recent Labs  Lab 07/02/2020 1806 07/06/2020 1827 06/25/2020 1839  WBC 23.2*  --   --   CREATININE 2.00*  --  1.80*  LATICACIDVEN  --  >11.0*  --     Estimated Creatinine Clearance: 17.9 mL/min (A) (by C-G formula based on SCr of 1.8 mg/dL (H)).    Allergies  Allergen Reactions  . Codeine Hives    Antimicrobials this admission: Zosyn 2/4 >>   Microbiology results: 2/4 BCx: ordered 2/4 UCx: ordered   Thank you for allowing pharmacy to be a part of this patient's care.  Dimple Nanas, PharmD PGY-1 Acute Care Pharmacy Resident Office: [phone] 06/17/2020 9:04 PM

## 2020-06-13 NOTE — Consult Note (Signed)
CARDIOLOGY CONSULT NOTE  Patient ID: Tracy Donaldson MRN: [id number] DOB/AGE: 01-15-1930 87 y.o.  Admit date: 06/28/2020 Attending physician: Tegeler, Gwenyth Allegra, * Primary Physician:  Janie Morning, DO Outpatient Cardiologist: Dr. Vernell Leep  Inpatient Cardiologist: Rex Kras, DO, [facility]  Chief complaint: cardiac arrest  Reason of Consult: Cardiac Arrest   HPI:  Tracy Donaldson is a 87 y.o. Caucasian female who presents with a chief complaint of " status post cardiac arrest." Her past medical history and cardiovascular risk factors include: Hypertension with chronic kidney disease, insulin-dependent diabetes mellitus type 2, hyperlipidemia, left bundle branch block, postmenopausal female, advanced age.  Patient is currently intubated and therefore history of present illness was obtained by the ER physician.  No family present at bedside.  Patient was complaining of indigestion earlier today and as she was walking for living quarters she tripped and fell.  Patient became unresponsive and resuscitation efforts were initiated by family members.  EMS arrived the patient was found to be in PEA arrest.  She was resuscitated for approximately 15 minutes until ROSC was achieved.  Initial rhythm was sinus tachycardia/bradycardia and therefore she required transcutaneous pacing and epinephrine was initiated.  When she presented to the hospital she was evaluated by our ER colleagues and was hemodynamically stabilized. Cardiology was consulted for further evaluation and management as she presented w/ PEA arrest.   ALLERGIES: Allergies  Allergen Reactions  . Codeine Hives    PAST MEDICAL HISTORY: Past Medical History:  Diagnosis Date  . CHF (congestive heart failure) ([place])   . Chronic kidney disease   . Coronary artery disease   . Hyperlipidemia   . Hypertension     PAST SURGICAL HISTORY: Past Surgical History:  Procedure Laterality Date  . APPENDECTOMY    . CATARACT EXTRACTION W/PHACO  Right 12/15/2006   unknown  . CATARACT EXTRACTION W/PHACO Left 05/18/2007   unknown  . EYE SURGERY Left    TPPV  . HYSTEROTOMY    . TONSILLECTOMY      FAMILY HISTORY: The patient family history includes Diabetes in her brother, father, mother, and sister; Hypertension in her father and mother.   SOCIAL HISTORY:  The patient  reports that she has never smoked. She has never used smokeless tobacco. She reports current alcohol use of about 1.0 standard drink of alcohol per week. She reports that she does not use drugs.  MEDICATIONS: Current Outpatient Medications  Medication Instructions  . amLODipine (NORVASC) 5 mg, Oral  . amLODipine (NORVASC) 5 mg, Oral, Daily  . DULoxetine (CYMBALTA) 20 mg, Oral, Daily  . glipiZIDE (GLUCOTROL XL) 5 mg, Oral, Daily  . glipiZIDE (GLUCOTROL) 5 mg, Oral, 2 times daily  . glucose blood (ONE TOUCH ULTRA TEST) test strip CHECK CBG ONCE A DAY IN VITRO 90 DAYS  . glucose blood test strip CHECK CBG ONCE A DAY IN VITRO 90  . losartan (COZAAR) 100 mg, Oral, Daily  . losartan-hydrochlorothiazide (HYZAAR) 100-12.5 MG tablet 1 tablet, Oral, Daily  . losartan-hydrochlorothiazide (HYZAAR) 50-12.5 MG tablet Oral  . metoprolol succinate (TOPROL-XL) 50 MG 24 hr tablet 1 tablet, Oral, Daily  . metoprolol tartrate (LOPRESSOR) 50 MG tablet Oral  . ondansetron (ZOFRAN) 8 MG tablet Oral, Daily at bedtime  . simvastatin (ZOCOR) 5 MG tablet 1 tablet, Oral, Daily  . sitaGLIPtin (JANUVIA) 25 MG tablet 1 tablet, Daily  . TRESIBA FLEXTOUCH 100 UNIT/ML FlexTouch Pen Subcutaneous  . tretinoin (RETIN-A) 0.05 % cream APPLY ONCE TOPICALLY TO FACE AT BEDTIME  REVIEW OF SYSTEMS: Review of Systems  Unable to perform ROS: intubated   PHYSICAL EXAM: Vitals with BMI 06/27/2020 07/02/2020 06/12/2020  Height - - -  Weight - - -  BMI - - -  Systolic 443 154 008  Diastolic 61 62 61  Pulse 676 108 118    Intake/Output Summary (Last 24 hours) at 06/21/2020 1953 Last data filed at  06/16/2020 1847 Gross per 24 hour  Intake 19.03 ml  Output 200 ml  Net -180.97 ml    CONSTITUTIONAL: Appears older than stated age, intubated, hemodynamically stable. SKIN: Skin is warm and dry. No rash noted. No cyanosis. No jaundice HEAD: Normocephalic and atraumatic.  EYES: No scleral icterus  MOUTH/THROAT: ET tube in place, OG tube in place, NECK: C-collar in place, unable to evaluate JVP.Marland Kitchen No thyromegaly noted. LYMPHATIC: No visible cervical adenopathy.  CHEST Normal respiratory effort. No intercostal retractions.  Defibrillator pads present. LUNGS: Mechanical breath sounds bilaterally.   CARDIOVASCULAR: Tachycardia, positive S1-S2, no murmurs rubs or gallops appreciated tachycardia. ABDOMINAL: Obese, soft, nontender, distended, positive bowel sounds in all 4 quadrants no apparent ascites.  EXTREMITIES: Warm to touch bilaterally, 2+ bilateral radial pulses, 2+ bilateral femoral pulses, palpable DP and PT. NEUROLOGIC: Intubated and sedated.   PSYCHIATRIC: Intubated and sedated.  RADIOLOGY: DG Pelvis Portable  Result Date: 06/23/2020 CLINICAL DATA:  Encounter for trauma, fall EXAM: PORTABLE PELVIS 1-2 VIEWS COMPARISON:  None. FINDINGS: There is no evidence of pelvic fracture or diastasis. Vascular calcifications. Catheter tubing overlies the central pelvis. IMPRESSION: No acute fracture or dislocation of the pelvis. Electronically Signed   By: Dahlia Bailiff MD   On: 06/14/2020 19:16   DG Chest Portable 1 View  Result Date: 06/27/2020 CLINICAL DATA:  Post intubation encounter EXAM: PORTABLE CHEST 1 VIEW COMPARISON:  None. FINDINGS: Endotracheal tube with tip overlying the midthoracic trachea. Nasogastric tube coursing below the diaphragm with side port overlying the stomach and tip obscured by collimation. Left IJ central venous catheter with tip oriented in the left subclavian artery. Defibrillator leads overlie the left chest and mediastinum obscuring view. The heart size and mediastinal  contours are obscured. Bilateral interstitial and airspace opacities with the left chest predominantly obscured by defibrillator pads. IMPRESSION: 1. Left IJ central venous catheter with tip overlying the left subclavian artery. 2. Satisfactory positioning of the endotracheal tube and nasogastric tube. 3. Bilateral interstitial and airspace opacities with the left chest predominantly obscured by defibrillator pads. 4. Evaluation of mediastinum is obscured by defibrillator leads and material external to the patient. Electronically Signed   By: Dahlia Bailiff MD   On: 06/12/2020 19:14    LABORATORY DATA: Lab Results  Component Value Date   WBC 23.2 (H) 06/16/2020   HGB 10.9 (L) 06/30/2020   HCT 32.0 (L) 06/12/2020   MCV 92.7 06/22/2020   PLT 268 06/22/2020    Recent Labs  Lab 07/04/2020 1806 06/16/2020 1839  NA 122* 121*  K 5.5* 5.4*  CL 92* 92*  CO2 10*  --   BUN 61* 67*  CREATININE 2.00* 1.80*  CALCIUM 8.0*  --   GLUCOSE 466* 457*    Lipid Panel  No results found for: CHOL, TRIG, HDL, CHOLHDL, VLDL, LDLCALC  BNP (last 3 results) No results for input(s): BNP in the last 8760 hours.  HEMOGLOBIN A1C No results found for: HGBA1C, MPG  Cardiac Panel (last 3 results) No results for input(s): CKTOTAL, CKMB, RELINDX in the last 8760 hours.  Invalid input(s): TROPONINHS  No results  found for: CKTOTAL, CKMB, CKMBINDEX   TSH No results for input(s): TSH in the last 8760 hours.    CARDIAC DATABASE: EKG: 06/30/2020: Normal sinus rhythm, 96 bpm, left bundle branch block, ST-T changes in the inferior and lateral leads cannot exclude ischemia, without underlying injury pattern.  No prior EKGs for comparison.  Echocardiogram: 10/11/2018 :  Normal LV systolic function with visual EF 50-55%. Left ventricle cavity is normal in size. Normal global wall motion. Doppler evidence of grade I (impaired) diastolic dysfunction, elevated LAP.  Left atrial cavity is mildly dilated at 4.4 cm.  Mild  (Grade I) mitral regurgitation.  IMPRESSION & RECOMMENDATIONS: Tracy Donaldson is a 87 y.o. Caucasian female whose past medical history and cardiovascular risk factors include: Hypertension with chronic kidney disease, non-insulin-dependent diabetes mellitus type 2, hyperlipidemia, left bundle branch block, postmenopausal female, advanced age.  Impression:  S/P PEA Cardiac Arrest  Elevated Troponin most-likely secondary to cardiac arrest and resuscitation efforts. Left bundle branch block Hypertension  Insulin-dependent diabetes mellitus type 2 Lactic Acidosis w/ AGMA  Hyponatremia (corrected for glucose 131 on presentation) Hyperkalemia  Leukocytosis (infectious vs. Reactive):   Plan: Cardiology was consulted as the patient presented to the hospital status post PEA arrest.  She had a witnessed cardiac arrest and BLS was provided by bystandards.  When EMS arrived patient was noted to have a PEA arrest.  She was resuscitated for 15 minutes and ROSC was achieved.  Patient eventually became bradycardic and required transcutaneous pacing and placed on epinephrine.   On arrival she was evaluated and stabilized by the ER team. Cardiology was consulted secondary to the cardiac arrest.  EKG shows normal sinus rhythm with underlying left bundle branch block and ST-T changes in inferolateral leads which may be secondary to LBBB but underlying ischemia cannot be ruled out.  Initial high sensitive troponins 396.    On initial presentation lab work illustrates multiple electrolyte abnormalities, lactic acidosis greater than 11, leukocytosis, hyperglycemia.   Given the PEA arrest reversible causes were reviewed.  Patient was scheduled for CT of the brain without contrast.  Due to poor renal function requested CT of the chest as well to rule out pulmonary embolism and/or its sequelae.   Patient was evaluated in [place] A after the CT scan at the time she was no longer on pressor support and is  maintaining  her pressures and her underlying rhythm is sinus tachycardia.   Final CT results are pending; however, review of images notes rib fractures and questionable right pneumothorax. Await final results.    Based on the course of events and the current workup I hypothesize that the underlying cause of her cardiac arrest was probably due to multiple electrolyte abnormalities in the setting of hyperglycemia.    Patient is currently being evaluated by critical care medicine and trauma service.  Given her out of hospital witnessed cardiac arrest and immediate resuscitation efforts with successful ROSC she should be consider for targeted temperature management for post cardiac arrest care.  Will defer to CCM at this time given the other acute noncardiac findings as the case evolves and as per family's goals of care.   Case and EKGs reviewed with Dr. Einar Gip (interventionalist on call for [facility] cardiovascular) no acute cardiac intervention recommendations. Continue your care regarding her active problems and comorbid conditions. Recommend continuous telemetry, trend troponins, pressor support if required, have pacer pad on if needed, echo ordered.   Case and recommendations reviewed with ER physician Dr. Sherry Ruffing and  CCM physician extender.  Also called her daughter Miachel Roux at [phone] regarding the course of events.   Will follow the patient with you.   CRITICAL CARE Performed by: Rex Kras   Total critical care time: 67 minutes   Critical care time was exclusive of separately billable procedures and treating other patients.   Critical care was necessary to treat or prevent imminent or life-threatening deterioration.   Critical care was time spent personally by me on the following activities: development of treatment plan with patient and/or surrogate as well as nursing, discussions with consultants, evaluation of patient's response to treatment, examination of patient, obtaining history  from patient or surrogate, ordering and performing treatments and interventions, ordering and review of laboratory studies, ordering and review of radiographic studies, pulse oximetry and re-evaluation of patient's condition.  Mechele Claude [facility]  Pager: [phone] Office: [phone] 07/07/2020, 7:53 PM

## 2020-06-14 ENCOUNTER — Inpatient Hospital Stay (HOSPITAL_COMMUNITY): Payer: Medicare Other

## 2020-06-14 DIAGNOSIS — N171 Acute kidney failure with acute cortical necrosis: Secondary | ICD-10-CM | POA: Diagnosis not present

## 2020-06-14 DIAGNOSIS — N170 Acute kidney failure with tubular necrosis: Secondary | ICD-10-CM

## 2020-06-14 DIAGNOSIS — G931 Anoxic brain damage, not elsewhere classified: Secondary | ICD-10-CM

## 2020-06-14 LAB — HEMOGLOBIN A1C
Hgb A1c MFr Bld: 9.8 % — ABNORMAL HIGH (ref 4.8–5.6)
Mean Plasma Glucose: 234.56 mg/dL

## 2020-06-14 LAB — BASIC METABOLIC PANEL
Anion gap: 14 (ref 5–15)
BUN: 67 mg/dL — ABNORMAL HIGH (ref 8–23)
CO2: 16 mmol/L — ABNORMAL LOW (ref 22–32)
Calcium: 7.7 mg/dL — ABNORMAL LOW (ref 8.9–10.3)
Chloride: 97 mmol/L — ABNORMAL LOW (ref 98–111)
Creatinine, Ser: 2.09 mg/dL — ABNORMAL HIGH (ref 0.44–1.00)
GFR, Estimated: 22 mL/min — ABNORMAL LOW (ref >60)
Glucose, Bld: 154 mg/dL — ABNORMAL HIGH (ref 70–99)
Potassium: 4.5 mmol/L (ref 3.5–5.1)
Sodium: 127 mmol/L — ABNORMAL LOW (ref 135–145)

## 2020-06-14 LAB — GLUCOSE, CAPILLARY
Glucose-Capillary: 132 mg/dL — ABNORMAL HIGH (ref 70–99)
Glucose-Capillary: 137 mg/dL — ABNORMAL HIGH (ref 70–99)
Glucose-Capillary: 139 mg/dL — ABNORMAL HIGH (ref 70–99)
Glucose-Capillary: 146 mg/dL — ABNORMAL HIGH (ref 70–99)
Glucose-Capillary: 154 mg/dL — ABNORMAL HIGH (ref 70–99)
Glucose-Capillary: 161 mg/dL — ABNORMAL HIGH (ref 70–99)
Glucose-Capillary: 167 mg/dL — ABNORMAL HIGH (ref 70–99)
Glucose-Capillary: 177 mg/dL — ABNORMAL HIGH (ref 70–99)
Glucose-Capillary: 224 mg/dL — ABNORMAL HIGH (ref 70–99)
Glucose-Capillary: 335 mg/dL — ABNORMAL HIGH (ref 70–99)
Glucose-Capillary: 370 mg/dL — ABNORMAL HIGH (ref 70–99)
Glucose-Capillary: 440 mg/dL — ABNORMAL HIGH (ref 70–99)

## 2020-06-14 LAB — HEPATIC FUNCTION PANEL
ALT: 131 U/L — ABNORMAL HIGH (ref 0–44)
AST: 118 U/L — ABNORMAL HIGH (ref 15–41)
Albumin: 2.4 g/dL — ABNORMAL LOW (ref 3.5–5.0)
Alkaline Phosphatase: 125 U/L (ref 38–126)
Bilirubin, Direct: 0.4 mg/dL — ABNORMAL HIGH (ref 0.0–0.2)
Indirect Bilirubin: 0.9 mg/dL (ref 0.3–0.9)
Total Bilirubin: 1.3 mg/dL — ABNORMAL HIGH (ref 0.3–1.2)
Total Protein: 4.8 g/dL — ABNORMAL LOW (ref 6.5–8.1)

## 2020-06-14 LAB — CBC
HCT: 29.1 % — ABNORMAL LOW (ref 36.0–46.0)
Hemoglobin: 10.1 g/dL — ABNORMAL LOW (ref 12.0–15.0)
MCH: 29.9 pg (ref 26.0–34.0)
MCHC: 34.7 g/dL (ref 30.0–36.0)
MCV: 86.1 fL (ref 80.0–100.0)
Platelets: 194 10*3/uL (ref 150–400)
RBC: 3.38 MIL/uL — ABNORMAL LOW (ref 3.87–5.11)
RDW: 12.8 % (ref 11.5–15.5)
WBC: 18.3 10*3/uL — ABNORMAL HIGH (ref 4.0–10.5)
nRBC: 0 % (ref 0.0–0.2)

## 2020-06-14 LAB — ECHOCARDIOGRAM COMPLETE
Calc EF: 30.7 %
Height: 63 in
S' Lateral: 3.6 cm
Single Plane A2C EF: 25.9 %
Single Plane A4C EF: 38.1 %
Weight: 2000 oz

## 2020-06-14 LAB — LACTIC ACID, PLASMA: Lactic Acid, Venous: 5.3 mmol/L (ref 0.5–1.9)

## 2020-06-14 LAB — MRSA PCR SCREENING: MRSA by PCR: NEGATIVE

## 2020-06-14 LAB — POCT I-STAT 7, (LYTES, BLD GAS, ICA,H+H)
Acid-base deficit: 9 mmol/L — ABNORMAL HIGH (ref 0.0–2.0)
Bicarbonate: 15.4 mmol/L — ABNORMAL LOW (ref 20.0–28.0)
Calcium, Ion: 1.13 mmol/L — ABNORMAL LOW (ref 1.15–1.40)
HCT: 28 % — ABNORMAL LOW (ref 36.0–46.0)
Hemoglobin: 9.5 g/dL — ABNORMAL LOW (ref 12.0–15.0)
O2 Saturation: 98 %
Patient temperature: 36
Potassium: 4.2 mmol/L (ref 3.5–5.1)
Sodium: 126 mmol/L — ABNORMAL LOW (ref 135–145)
TCO2: 16 mmol/L — ABNORMAL LOW (ref 22–32)
pCO2 arterial: 26.4 mmHg — ABNORMAL LOW (ref 32.0–48.0)
pH, Arterial: 7.37 (ref 7.350–7.450)
pO2, Arterial: 110 mmHg — ABNORMAL HIGH (ref 83.0–108.0)

## 2020-06-14 LAB — MAGNESIUM: Magnesium: 1.8 mg/dL (ref 1.7–2.4)

## 2020-06-14 LAB — CBG MONITORING, ED: Glucose-Capillary: 505 mg/dL (ref 70–99)

## 2020-06-14 MED ORDER — VITAL AF 1.2 CAL PO LIQD
1000.0000 mL | ORAL | Status: DC
  Administered 2020-06-14: 1000 mL

## 2020-06-14 MED ORDER — LACTATED RINGERS IV SOLN: INTRAVENOUS | Status: DC

## 2020-06-14 MED ORDER — SODIUM CHLORIDE 0.9% FLUSH
10.0000 mL | Freq: Three times a day (TID) | INTRAVENOUS | Status: DC
  Administered 2020-06-14 – 2020-06-15 (×3): 10 mL

## 2020-06-14 MED ORDER — PERFLUTREN LIPID MICROSPHERE
1.0000 mL | INTRAVENOUS | Status: AC | PRN
  Administered 2020-06-14: 2 mL via INTRAVENOUS
  Filled 2020-06-14: qty 10

## 2020-06-14 MED ORDER — CHLORHEXIDINE GLUCONATE 0.12% ORAL RINSE (MEDLINE KIT)
15.0000 mL | Freq: Two times a day (BID) | OROMUCOSAL | Status: DC
  Administered 2020-06-14 – 2020-06-15 (×3): 15 mL via OROMUCOSAL

## 2020-06-14 MED ORDER — LEVETIRACETAM IN NACL 500 MG/100ML IV SOLN
500.0000 mg | Freq: Two times a day (BID) | INTRAVENOUS | Status: DC
  Administered 2020-06-14 – 2020-06-15 (×3): 500 mg via INTRAVENOUS
  Filled 2020-06-14 (×3): qty 100

## 2020-06-14 MED ORDER — ORAL CARE MOUTH RINSE
15.0000 mL | OROMUCOSAL | Status: DC
  Administered 2020-06-14 – 2020-06-15 (×13): 15 mL via OROMUCOSAL

## 2020-06-14 MED ORDER — LORAZEPAM 2 MG/ML IJ SOLN
2.0000 mg | Freq: Once | INTRAMUSCULAR | Status: AC
  Administered 2020-06-14: 2 mg via INTRAVENOUS
  Filled 2020-06-14: qty 1

## 2020-06-14 MED ORDER — INSULIN REGULAR(HUMAN) IN NACL 100-0.9 UT/100ML-% IV SOLN
INTRAVENOUS | Status: DC
  Administered 2020-06-14: 3.2 [IU]/h via INTRAVENOUS
  Filled 2020-06-14: qty 100

## 2020-06-14 MED ORDER — INSULIN ASPART 100 UNIT/ML ~~LOC~~ SOLN
0.0000 [IU] | SUBCUTANEOUS | Status: DC
  Administered 2020-06-14: 4 [IU] via SUBCUTANEOUS
  Administered 2020-06-14: 3 [IU] via SUBCUTANEOUS
  Administered 2020-06-14: 4 [IU] via SUBCUTANEOUS
  Administered 2020-06-15: 3 [IU] via SUBCUTANEOUS

## 2020-06-14 MED ORDER — CHLORHEXIDINE GLUCONATE CLOTH 2 % EX PADS
6.0000 | MEDICATED_PAD | Freq: Every day | CUTANEOUS | Status: DC
  Administered 2020-06-15 (×2): 6 via TOPICAL

## 2020-06-14 MED ORDER — MIDAZOLAM HCL 2 MG/2ML IJ SOLN
2.0000 mg | INTRAMUSCULAR | Status: AC | PRN
  Administered 2020-06-14 (×9): 2 mg via INTRAVENOUS
  Filled 2020-06-14 (×9): qty 2

## 2020-06-14 MED ORDER — VITAL HIGH PROTEIN PO LIQD: 1000.0000 mL | ORAL | Status: DC

## 2020-06-14 MED ORDER — ADULT MULTIVITAMIN W/MINERALS CH
1.0000 | ORAL_TABLET | Freq: Every day | ORAL | Status: DC
  Administered 2020-06-14: 1
  Filled 2020-06-14: qty 1

## 2020-06-14 MED ORDER — DEXTROSE 50 % IV SOLN: 0.0000 mL | INTRAVENOUS | Status: DC | PRN

## 2020-06-14 MED ORDER — INSULIN DETEMIR 100 UNIT/ML ~~LOC~~ SOLN
8.0000 [IU] | Freq: Two times a day (BID) | SUBCUTANEOUS | Status: DC
  Administered 2020-06-14 (×2): 8 [IU] via SUBCUTANEOUS
  Filled 2020-06-14 (×5): qty 0.08

## 2020-06-14 MED ORDER — INSULIN ASPART 100 UNIT/ML ~~LOC~~ SOLN
10.0000 [IU] | Freq: Once | SUBCUTANEOUS | Status: AC
  Administered 2020-06-14: 10 [IU] via SUBCUTANEOUS

## 2020-06-14 NOTE — Progress Notes (Signed)
Subjective/Chief Complaint: No meaningful movements other than some spasms   Objective: Vital signs in last 24 hours: Temp:  [95.7 F (35.4 C)-98.7 F (37.1 C)] 95.7 F (35.4 C) (02/05 0600) Pulse Rate:  [76-141] 95 (02/05 0800) Resp:  [12-33] 20 (02/05 0800) BP: (84-184)/(46-111) 136/63 (02/05 0800) SpO2:  [85 %-100 %] 100 % (02/05 0800) FiO2 (%):  [60 %-100 %] 60 % (02/05 0735) Weight:  [56.7 kg] 56.7 kg (02/04 1835) Last BM Date: 06/14/20  Intake/Output from previous day: 02/04 0701 - 02/05 0700 In: 253.1 [I.V.:103.1; IV Piggyback:150] Out: 829 [Urine:479; Chest Tube:350] Intake/Output this shift: No intake/output data recorded.  General appearance: unresponsive except for spasms Resp: rhonchi bilaterally and on vent Cardio: regular rate and rhythm GI: soft, nontender  Lab Results:  Recent Labs    07/05/2020 1806 06/20/2020 1839 06/27/2020 2234 06/14/20 0419  WBC 23.2*  --  27.0*  --   HGB 10.7*   < > 11.4* 9.5*  HCT 33.1*   < > 33.6* 28.0*  PLT 268  --  228  --    < > = values in this interval not displayed.   BMET Recent Labs    06/20/2020 1806 06/17/2020 1839 06/26/2020 2028 06/14/2020 2234 06/14/20 0419  NA 122* 121*   < > 122* 126*  K 5.5* 5.4*   < > 5.6* 4.2  CL 92* 92*  --  92*  --   CO2 10*  --   --  15*  --   GLUCOSE 466* 457*  --  463*  --   BUN 61* 67*  --  63*  --   CREATININE 2.00* 1.80*  --  1.95*  --   CALCIUM 8.0*  --   --  7.9*  --    < > = values in this interval not displayed.   PT/INR Recent Labs    06/12/2020 1827 06/29/2020 2234  LABPROT 15.4* 14.7  INR 1.3* 1.2   ABG Recent Labs    07/01/2020 2028 06/14/20 0419  PHART 7.147* 7.370  HCO3 16.1* 15.4*    Studies/Results: CT HEAD WO CONTRAST  Result Date: 06/16/2020 CLINICAL DATA:  Poly trauma, critical head and C-spine injury. EXAM: CT HEAD WITHOUT CONTRAST CT CERVICAL SPINE WITHOUT CONTRAST TECHNIQUE: Multidetector CT imaging of the head and cervical spine was performed  following the standard protocol without intravenous contrast. Multiplanar CT image reconstructions of the cervical spine were also generated. COMPARISON:  None. FINDINGS: CT HEAD FINDINGS Brain: No evidence of acute infarction, hemorrhage, extra-axial collection, ventriculomegaly, or mass effect. Generalized cerebral atrophy. Periventricular white matter low attenuation likely secondary to microangiopathy. Vascular: Cerebrovascular atherosclerotic calcifications are noted. Skull: Negative for fracture or focal lesion. Old posttraumatic deformity of bilateral nasal bones. Sinuses/Orbits: Visualized portions of the orbits are unremarkable. Visualized portions of the paranasal sinuses are unremarkable. Visualized portions of the mastoid air cells are unremarkable. Other: None. CT CERVICAL SPINE FINDINGS Alignment: Normal. Skull base and vertebrae: No acute fracture. No primary bone lesion or focal pathologic process. Mineralization along the posterior aspect of the dens as can be seen with a crystalline arthropathy. Soft tissues and spinal canal: No prevertebral fluid or swelling. No visible canal hematoma. Disc levels: Degenerative disease with disc height loss at C4-5 and C5-6. At C3-4 there is a small central disc protrusion. At C4-5 there is left uncovertebral degenerative changes mild left foraminal narrowing. At C5-6 there is a broad-based disc osteophyte complex, bilateral uncovertebral degenerative changes, and bilateral foraminal narrowing. Upper  chest: Bilateral pleural effusions. Airspace disease in the left lung apex. Partially visualized large right pneumothorax. Other: Endotracheal tube with the tip in satisfactory position above the carina. IMPRESSION: 1. No acute intracranial pathology. 2. No acute osseous injury of the cervical spine. 3. Partially visualized large right hydropneumothorax. 4. Bilateral pleural effusions. Airspace disease in the left lung apex which may reflect pulmonary contusion or  aspiration. Critical Value/emergent results were called by telephone at the time of interpretation on 07/07/2020 at 7:34 pm to provider [facility] , who verbally acknowledged these results. Electronically Signed   By: Kathreen Devoid   On: 06/17/2020 19:34   CT CERVICAL SPINE WO CONTRAST  Result Date: 06/27/2020 CLINICAL DATA:  Poly trauma, critical head and C-spine injury. EXAM: CT HEAD WITHOUT CONTRAST CT CERVICAL SPINE WITHOUT CONTRAST TECHNIQUE: Multidetector CT imaging of the head and cervical spine was performed following the standard protocol without intravenous contrast. Multiplanar CT image reconstructions of the cervical spine were also generated. COMPARISON:  None. FINDINGS: CT HEAD FINDINGS Brain: No evidence of acute infarction, hemorrhage, extra-axial collection, ventriculomegaly, or mass effect. Generalized cerebral atrophy. Periventricular white matter low attenuation likely secondary to microangiopathy. Vascular: Cerebrovascular atherosclerotic calcifications are noted. Skull: Negative for fracture or focal lesion. Old posttraumatic deformity of bilateral nasal bones. Sinuses/Orbits: Visualized portions of the orbits are unremarkable. Visualized portions of the paranasal sinuses are unremarkable. Visualized portions of the mastoid air cells are unremarkable. Other: None. CT CERVICAL SPINE FINDINGS Alignment: Normal. Skull base and vertebrae: No acute fracture. No primary bone lesion or focal pathologic process. Mineralization along the posterior aspect of the dens as can be seen with a crystalline arthropathy. Soft tissues and spinal canal: No prevertebral fluid or swelling. No visible canal hematoma. Disc levels: Degenerative disease with disc height loss at C4-5 and C5-6. At C3-4 there is a small central disc protrusion. At C4-5 there is left uncovertebral degenerative changes mild left foraminal narrowing. At C5-6 there is a broad-based disc osteophyte complex, bilateral uncovertebral  degenerative changes, and bilateral foraminal narrowing. Upper chest: Bilateral pleural effusions. Airspace disease in the left lung apex. Partially visualized large right pneumothorax. Other: Endotracheal tube with the tip in satisfactory position above the carina. IMPRESSION: 1. No acute intracranial pathology. 2. No acute osseous injury of the cervical spine. 3. Partially visualized large right hydropneumothorax. 4. Bilateral pleural effusions. Airspace disease in the left lung apex which may reflect pulmonary contusion or aspiration. Critical Value/emergent results were called by telephone at the time of interpretation on 07/03/2020 at 7:34 pm to provider [facility] , who verbally acknowledged these results. Electronically Signed   By: Kathreen Devoid   On: 06/14/2020 19:34   DG Pelvis Portable  Result Date: 07/01/2020 CLINICAL DATA:  Encounter for trauma, fall EXAM: PORTABLE PELVIS 1-2 VIEWS COMPARISON:  None. FINDINGS: There is no evidence of pelvic fracture or diastasis. Vascular calcifications. Catheter tubing overlies the central pelvis. IMPRESSION: No acute fracture or dislocation of the pelvis. Electronically Signed   By: Dahlia Bailiff MD   On: 06/27/2020 19:16   DG Chest Port 1 View  Result Date: 06/14/2020 CLINICAL DATA:  Acute respiratory failure. EXAM: PORTABLE CHEST 1 VIEW COMPARISON:  One-view chest x-ray 06/12/2020 FINDINGS: Heart size is normal. Endotracheal tube stable, in satisfactory position. OG tube courses off the inferior border the film. Right-sided chest tube is in place. Small apical pneumothorax measures 5% or less. Hazy opacities over the left lung likely represent edema. IMPRESSION: 1. Small right apical pneumothorax measures  5% or less, similar to the prior exam. 2. Stable support apparatus. 3. Hazy opacities over the left lung likely representing edema. Electronically Signed   By: San Morelle M.D.   On: 06/14/2020 05:16   DG Chest Port 1 View  Result Date:  07/06/2020 CLINICAL DATA:  Pneumothorax post chest tube placement EXAM: PORTABLE CHEST 1 VIEW COMPARISON:  Same day CT chest and chest radiograph FINDINGS: Interval placement of a right chest pigtail catheter with decreased size of the now small right pneumothorax. Endotracheal tube with tip overlying the distal thoracic trachea. Nasogastric tube coursing below the diaphragm with side port overlying the stomach and tip obscured by collimation. The heart size and mediastinal contours appear unchanged. Left hemithorax is obscured by overlying defibrillator pads. Similar bilateral opacities. The visualized skeletal structures are unchanged. IMPRESSION: Interval placement of the right chest pigtail catheter with decreased size of the now small right pneumothorax. Electronically Signed   By: Dahlia Bailiff MD   On: 06/29/2020 21:09   DG Chest Portable 1 View  Result Date: 06/20/2020 CLINICAL DATA:  Post intubation encounter EXAM: PORTABLE CHEST 1 VIEW COMPARISON:  None. FINDINGS: Endotracheal tube with tip overlying the midthoracic trachea. Nasogastric tube coursing below the diaphragm with side port overlying the stomach and tip obscured by collimation. Left IJ central venous catheter with tip oriented in the left subclavian artery. Defibrillator leads overlie the left chest and mediastinum obscuring view. The heart size and mediastinal contours are obscured. Bilateral interstitial and airspace opacities with the left chest predominantly obscured by defibrillator pads. IMPRESSION: 1. Left IJ central venous catheter with tip overlying the left subclavian artery. 2. Satisfactory positioning of the endotracheal tube and nasogastric tube. 3. Bilateral interstitial and airspace opacities with the left chest predominantly obscured by defibrillator pads. 4. Evaluation of mediastinum is obscured by defibrillator leads and material external to the patient. Electronically Signed   By: Dahlia Bailiff MD   On: 06/19/2020 19:14    CT CHEST ABDOMEN PELVIS WO CONTRAST  Result Date: 06/28/2020 CLINICAL DATA:  Fall down 4 stairs as well as myocardial rest status post CPR EXAM: CT CHEST, ABDOMEN AND PELVIS WITHOUT CONTRAST TECHNIQUE: Multidetector CT imaging of the chest, abdomen and pelvis was performed following the standard protocol without IV contrast. COMPARISON:  Chest x-ray 07/04/2020, CT abdomen pelvis 07/14/2018 FINDINGS: CHEST: Ports and Devices: Enteric tube with tip and side port terminating within the gastric lumen. Endotracheal tube with tip approximately 1.5 cm above the carina. Urinary bladder Foley catheter terminates within the urinary bladder lumen. Foley is noted to loop within the urinary bladder. Lungs/airways: Bilateral lower lobe consolidations. Peribronchovascular ground-glass airspace opacities within the left upper lobe. No pulmonary mass. No defnite pulmonary contusion or laceration. No pneumatocele formation. The central airways are patent. Pleura: Bilateral trace pleural effusions. Moderate volume right pneumothorax. No hemothorax. Lymph Nodes: No mediastinal, hilar, or axillary lymphadenopathy. Mediastinum: No pneumomediastinum. No aortic injury or mediastinal hematoma. The thoracic aorta is normal in caliber. The heart is normal in size. No significant pericardial effusion. At least mild to moderate three-vessel coronary artery calcifications. The esophagus is unremarkable. The thyroid is unremarkable. Chest Wall / Breasts: No chest wall mass. Musculoskeletal: Minimally displaced acute right fourth and fifth costochondral junction fractures (6:19). Nondisplaced right anterior fourth and fifth rib fractures (3: 23-25). Minimally displaced right anterior sixth rib fracture. Minimally displaced acute anterior left third rib fracture (3:20). Minimally displaced fracture acute of the left fifth, sixth, seventh, eighth (3:20- 59)). Nondisplaced sternal fracture (  7:82). Age-indeterminate minimal vertebral body height  loss of the T6, T8, and T10 vertebral bodies. ABDOMEN / PELVIS: Liver: Streak artifact limits evaluation of the liver. Not enlarged. No focal lesion. Biliary System: The gallbladder is otherwise unremarkable with no radio-opaque gallstones. No biliary ductal dilatation. Pancreas: Normal pancreatic contour. No main pancreatic duct dilatation. Spleen: Not enlarged. No focal lesion.   A splenule is noted. Adrenal Glands: No nodularity bilaterally. Kidneys: Nonspecific bilateral trace perinephric stranding. Bilateral kidneys enhance symmetrically. No hydronephrosis. No hydroureter. The urinary bladder is unremarkable. Bowel: No small or large bowel wall thickening or dilatation. No pneumatosis. Mesentery, Omentum, and Peritoneum: No simple free fluid ascites. No pneumoperitoneum. No hemoperitoneum. No mesenteric hematoma identified. No organized fluid collection. Pelvic Organs: Normal. Lymph Nodes: No abdominal, pelvic, inguinal lymphadenopathy. Vasculature: Extensive atherosclerotic plaque. No abdominal aorta or iliac aneurysm. No active contrast extravasation or pseudoaneurysm. Musculoskeletal: No significant soft tissue hematoma. No acute pelvic fracture. No spinal fracture. Multilevel degenerative changes of the spine with grade 1 anterolisthesis of L4 on L5 and multilevel intervertebral disc space vacuum phenomenon. IMPRESSION: 1. Moderate right pneumothorax and bilateral trace pleural effusions. 2. Bilateral peribronchovascular ground-glass airspace opacities and well as bilateral lower lobe consolidations that likely represent aspiration pneumonia. Followup PA and lateral chest X-ray is recommended in 3-4 weeks following therapy to ensure resolution and exclude underlying malignancy. 3. Bilateral minimally displaced rib fractures as well as right anterior third and fourth costochondral junction fractures. 4. Minimally displaced sternal fracture. 5. Otherwise no acute intra-abdominal or intrapelvic abnormality;  however, please note limited evaluation due to noncontrast study. 6. Age-indeterminate minimal T6, T8, and T10 compression fractures. Correlate with tenderness to palpation to evaluate for an acute component. 7.  Aortic Atherosclerosis (ICD10-I70.0) - extensive. 8. Endotracheal tube with tip approximately 1.5 cm above the carina. Consider retracting by 1 cm. 9. Urinary bladder Foley catheter loops within the urinary bladder. Consider retracting. These results were called by telephone at the time of interpretation on 06/10/2020 at 7:31 pm to provider [facility] , who verbally acknowledged these results. Electronically Signed   By: Iven Finn M.D.   On: 06/24/2020 19:45    Anti-infectives: Anti-infectives (From admission, onward)   Start     Dose/Rate Route Frequency Ordered Stop   07/04/2020 2200  piperacillin-tazobactam (ZOSYN) IVPB 2.25 g        2.25 g 100 mL/hr over 30 Minutes Intravenous Every 8 hours 06/18/2020 2100        Assessment/Plan: s/p * No surgery found * Rib fxs from cpr and/or fall. Pain management  R chest tube per CCM. Small residual pneumo. Continue tube to suction Cardiac arrest per cards Critical care per CCM We will see prn  LOS: 1 day    Autumn Messing III 06/14/2020

## 2020-06-14 NOTE — Progress Notes (Signed)
  Echocardiogram 2D Echocardiogram has been performed.  Merrie Roof F 06/14/2020, 11:09 AM

## 2020-06-14 NOTE — Progress Notes (Signed)
[facility] Progress Note Patient Name: Tracy Donaldson DOB: Aug 21, 1929 MRN: [id number]   Date of Service  06/14/2020  HPI/Events of Note  CBG 440 mg / dl, patient is having myoclonic jerking.  eICU Interventions  Insulin infusion ordered for optimal glycemic control, Versed PRN and Keppra ordered for myoclonic jerks.        [name] 06/14/2020, 2:34 AM

## 2020-06-14 NOTE — Progress Notes (Signed)
   06/14/20 1235  Clinical Encounter Type  Visited With Patient and family together  Visit Type Critical Care  Referral From Nurse  Consult/Referral To [facility] responded to page for critical Pt. Pt's family at bedside. Chaplain provided ministry of presence and prayed. Chaplain told family how to contact chaplain again if needed. Chaplain remains available.   This note was prepared by Chaplain Resident, Dante Gang, MDiv. Chaplain remains available as needed through the on-call pager: [phone].

## 2020-06-14 NOTE — Progress Notes (Signed)
eLink Physician-Brief Progress Note Patient Name: Tracy Donaldson DOB: 1929-08-24 MRN: [id number]   Date of Service  06/14/2020  HPI/Events of Note  Patient admitted with out of hospital cardiac arrest resulting in a fall down stairs, she had more than 15 minutes of CPR in the field, and had to have a chest tube placed in the ED due to pneumothorax, she is intubated and mechanically ventilated, with 36 degrees TTM in progress.  eICU Interventions  New Patient Evaluation completed.        Anvika Gashi U Niklas Chretien 06/14/2020, 2:00 AM

## 2020-06-14 NOTE — Progress Notes (Signed)
LTM EEG hooked up and running - no initial skin breakdown - push button tested - neuro notified. [facility] monitored 

## 2020-06-14 NOTE — Progress Notes (Signed)
EEG complete - results pending 

## 2020-06-14 NOTE — Progress Notes (Signed)
EEG flat with occasional myoclonus.  Daughter at bedside.  Discussed while technically too early to make full prognostication, she is 69 with early myoclonus after OOH PEA arrest with estimated CPC 1-2 of < 5%.  Family is going to come in to say their goodbyes today and tomorrow and we will likely allow Ms. Steinhart to pass in peace.  Full DNR.  Erskine Emery MD PCCM

## 2020-06-14 NOTE — Progress Notes (Addendum)
Initial Nutrition Assessment  RD working remotely.  DOCUMENTATION CODES:   Not applicable  INTERVENTION:  Initiate Vital AF 1.2 Cal at 20 mL/hr and advance by 15 mL/hr every 12 hours to goal rate of 50 mL/hr (1200 mL goal daily volume). Provides 1440 kcal, 90 grams of protein, 972 mL H2O daily.  Provide MVI daily per tube.  Monitor magnesium, potassium, and phosphorus daily for at least 3 days, MD to replete as needed, as pt is at risk for refeeding syndrome.  NUTRITION DIAGNOSIS:   Inadequate oral intake related to inability to eat as evidenced by NPO status.  GOAL:   Patient will meet greater than or equal to 90% of their needs  MONITOR:   Vent status,Labs,Weight trends,TF tolerance,I & O's  REASON FOR ASSESSMENT:   Ventilator,Consult Enteral/tube feeding initiation and management  ASSESSMENT:   87 year old female with PMHx of HTN, CKD, HLD, CHF, CAD admitted after acute PEA arrest, hypoxemic respiratory failure, AKI, right PTX s/p chest tube, right rib fracture, anoxic encephalopathy and myoclonus.   2/4 intubated 2/5 36C TTM initiated  Patient is currently intubated on ventilator support MV: 11.5 L/min Temp (24hrs), Avg:96.9 F (36.1 C), Min:95.7 F (35.4 C), Max:98.7 F (37.1 C)  Propofol: 10.08 ml/hr (266 kcal daily)  Medications reviewed and include: Colace 100 mg BID, Novolog 0-20 units Q4hrs, Levemir 8 units BID, Protonix, Miralax 17 grams daily, LR at 100 mL/hr, Keppra, Zosyn, propofol gtt.  Labs reviewed: CBG 224-370, Sodium 127, Chloride 97, CO2 16, BUN 67, Creatinine 2.09.  I/O: 479 mL UOP yesterday, 350 mL output from right chest tube overnight  Weight trend: 56 kg on 10/05/2018; currently documented to be 56.7 kg (125 lb) but this may be reported wt  Enteral Access: OGT vs NGT (not yet documented); courses below diaphragm with side port overlying stomach and tip obscured by collimation per chest x-ray 2/4  Unable to determine if patient meets  criteria for malnutrition at this time.  NUTRITION - FOCUSED PHYSICAL EXAM:  Unable to complete at this time as RD is working remotely.  Diet Order:   Diet Order            Diet NPO time specified  Diet effective now                EDUCATION NEEDS:   No education needs have been identified at this time  Skin:  Skin Assessment: Reviewed RN Assessment  Last BM:  06/14/2020 per chart  Height:   Ht Readings from Last 1 Encounters:  06/24/2020 5\' 3"  (1.6 m)   Weight:   Wt Readings from Last 1 Encounters:  06/28/2020 56.7 kg   BMI:  Body mass index is 22.14 kg/m.  Estimated Nutritional Needs:   Kcal:  1418  Protein:  85-100 grams  Fluid:  1.4-1.6 L/day  Jacklynn Barnacle, MS, RD, LDN Pager number available on Amion

## 2020-06-14 NOTE — ED Notes (Signed)
  Code cool activated.  Patient indwelling temp foley showing 96 degrees.  Artic sun pads not applied per MD and Agricultural consultant.  Shanon Brow [facility] RN notified as well.

## 2020-06-14 NOTE — Progress Notes (Signed)
Progress Note  Patient Name: Tracy Donaldson Date of Encounter: 06/14/2020  Attending physician: Candee Furbish, MD Primary care provider: Janie Morning, DO Consultant:Nyjah Schwake Terri Skains, DO  Subjective: Tracy Donaldson is a 87 y.o. female who was seen and examined at bedside at approximately 1228pm Reviewed the course of events from overnight with the patient's nurse. Patient's daughter Caren Griffins present at bedside.  Updated her from a cardiology standpoint. Patient remains intubated. Currently being treated with cardiac temperature management. Patient exhibits myoclonus movements.   Objective: Vital Signs in the last 24 hours: Temp:  [95.7 F (35.4 C)-98.7 F (37.1 C)] 97.3 F (36.3 C) (02/05 1000) Pulse Rate:  [76-141] 101 (02/05 1127) Resp:  [12-33] 33 (02/05 1127) BP: (84-184)/(46-111) 150/58 (02/05 1127) SpO2:  [85 %-100 %] 100 % (02/05 1127) FiO2 (%):  [60 %-100 %] 60 % (02/05 1127) Weight:  [56.7 kg] 56.7 kg (02/04 1835)  Intake/Output:  Intake/Output Summary (Last 24 hours) at 06/14/2020 1418 Last data filed at 06/14/2020 1000 Gross per 24 hour  Intake 345.24 ml  Output 874 ml  Net -528.76 ml    Net IO Since Admission: -528.76 mL [06/14/20 1418]  Weights:  Filed Weights   06/25/2020 1835  Weight: 56.7 kg    Telemetry: Personally reviewed.  Sinus mechanism with ectopic.  Physical examination: PHYSICAL EXAM: Vitals with BMI 06/14/2020 06/14/2020 06/14/2020  Height - - -  Weight - - -  BMI - - -  Systolic 161 096 045  Diastolic 58 63 54  Pulse 409 100 96    CONSTITUTIONAL: Appears older than stated age, intubated, hemodynamically stable. SKIN: Skin is warm and dry. No rash noted. No cyanosis. No jaundice HEAD: Normocephalic and atraumatic.  EEG wires present EYES: No scleral icterus.  Pinpoint pupils, not tracking. MOUTH/THROAT: ET tube in place, OG tube in place, NECK: No thyromegaly noted. LYMPHATIC: No visible cervical adenopathy.  CHEST: Chest tube present. LUNGS:  Mechanical breath sounds bilaterally.   CARDIOVASCULAR: Tachycardia, positive S1-S2, no murmurs rubs or gallops appreciated tachycardia. ABDOMINAL: Obese, cooing blanket present, soft, nontender, distended, positive bowel sounds in all 4 quadrants no apparent ascites.  EXTREMITIES: Warm to touch bilaterally, 2+ bilateral radial pulses, 2+ bilateral femoral pulses, palpable DP and PT. NEUROLOGIC: Intubated and sedated.   PSYCHIATRIC: Intubated and sedated.  Lab Results: Hematology Recent Labs  Lab 06/12/2020 1806 06/12/2020 1839 06/23/2020 2234 06/14/20 0419 06/14/20 0817  WBC 23.2*  --  27.0*  --  18.3*  RBC 3.57*  --  3.83*  --  3.38*  HGB 10.7*   < > 11.4* 9.5* 10.1*  HCT 33.1*   < > 33.6* 28.0* 29.1*  MCV 92.7  --  87.7  --  86.1  MCH 30.0  --  29.8  --  29.9  MCHC 32.3  --  33.9  --  34.7  RDW 12.6  --  12.8  --  12.8  PLT 268  --  228  --  194   < > = values in this interval not displayed.    Chemistry Recent Labs  Lab 06/20/2020 1806 06/21/2020 1839 06/20/2020 2028 06/23/2020 2234 06/14/20 0419 06/14/20 0817  NA 122* 121*   < > 122* 126* 127*  K 5.5* 5.4*   < > 5.6* 4.2 4.5  CL 92* 92*  --  92*  --  97*  CO2 10*  --   --  15*  --  16*  GLUCOSE 466* 457*  --  463*  --  154*  BUN 61* 67*  --  63*  --  67*  CREATININE 2.00* 1.80*  --  1.95*  --  2.09*  CALCIUM 8.0*  --   --  7.9*  --  7.7*  PROT  --   --   --   --   --  4.8*  ALBUMIN  --   --   --   --   --  2.4*  AST  --   --   --   --   --  118*  ALT  --   --   --   --   --  131*  ALKPHOS  --   --   --   --   --  125  BILITOT  --   --   --   --   --  1.3*  GFRNONAA 24*  --   --  24*  --  22*  ANIONGAP 20*  --   --  15  --  14   < > = values in this interval not displayed.     Cardiac Enzymes: Cardiac Panel (last 3 results) Recent Labs    07/06/2020 1806 06/18/2020 2234  TROPONINIHS 396* 1,242*    BNP (last 3 results) No results for input(s): BNP in the last 8760 hours.  ProBNP (last 3 results) No results for  input(s): PROBNP in the last 8760 hours.   DDimer No results for input(s): DDIMER in the last 168 hours.   Hemoglobin A1c:  Lab Results  Component Value Date   HGBA1C 9.8 (H) 06/14/2020   MPG 234.56 06/14/2020    TSH No results for input(s): TSH in the last 8760 hours.  Lipid Panel No results found for: CHOL, TRIG, HDL, CHOLHDL, VLDL, LDLCALC, LDLDIRECT  Imaging: CT HEAD WO CONTRAST  Result Date: 07/06/2020 CLINICAL DATA:  Poly trauma, critical head and C-spine injury. EXAM: CT HEAD WITHOUT CONTRAST CT CERVICAL SPINE WITHOUT CONTRAST TECHNIQUE: Multidetector CT imaging of the head and cervical spine was performed following the standard protocol without intravenous contrast. Multiplanar CT image reconstructions of the cervical spine were also generated. COMPARISON:  None. FINDINGS: CT HEAD FINDINGS Brain: No evidence of acute infarction, hemorrhage, extra-axial collection, ventriculomegaly, or mass effect. Generalized cerebral atrophy. Periventricular white matter low attenuation likely secondary to microangiopathy. Vascular: Cerebrovascular atherosclerotic calcifications are noted. Skull: Negative for fracture or focal lesion. Old posttraumatic deformity of bilateral nasal bones. Sinuses/Orbits: Visualized portions of the orbits are unremarkable. Visualized portions of the paranasal sinuses are unremarkable. Visualized portions of the mastoid air cells are unremarkable. Other: None. CT CERVICAL SPINE FINDINGS Alignment: Normal. Skull base and vertebrae: No acute fracture. No primary bone lesion or focal pathologic process. Mineralization along the posterior aspect of the dens as can be seen with a crystalline arthropathy. Soft tissues and spinal canal: No prevertebral fluid or swelling. No visible canal hematoma. Disc levels: Degenerative disease with disc height loss at C4-5 and C5-6. At C3-4 there is a small central disc protrusion. At C4-5 there is left uncovertebral degenerative changes mild  left foraminal narrowing. At C5-6 there is a broad-based disc osteophyte complex, bilateral uncovertebral degenerative changes, and bilateral foraminal narrowing. Upper chest: Bilateral pleural effusions. Airspace disease in the left lung apex. Partially visualized large right pneumothorax. Other: Endotracheal tube with the tip in satisfactory position above the carina. IMPRESSION: 1. No acute intracranial pathology. 2. No acute osseous injury of the cervical spine. 3. Partially visualized large right hydropneumothorax. 4. Bilateral pleural  effusions. Airspace disease in the left lung apex which may reflect pulmonary contusion or aspiration. Critical Value/emergent results were called by telephone at the time of interpretation on 07/07/2020 at 7:34 pm to provider [facility] , who verbally acknowledged these results. Electronically Signed   By: Kathreen Devoid   On: 07/01/2020 19:34   CT CERVICAL SPINE WO CONTRAST  Result Date: 06/21/2020 CLINICAL DATA:  Poly trauma, critical head and C-spine injury. EXAM: CT HEAD WITHOUT CONTRAST CT CERVICAL SPINE WITHOUT CONTRAST TECHNIQUE: Multidetector CT imaging of the head and cervical spine was performed following the standard protocol without intravenous contrast. Multiplanar CT image reconstructions of the cervical spine were also generated. COMPARISON:  None. FINDINGS: CT HEAD FINDINGS Brain: No evidence of acute infarction, hemorrhage, extra-axial collection, ventriculomegaly, or mass effect. Generalized cerebral atrophy. Periventricular white matter low attenuation likely secondary to microangiopathy. Vascular: Cerebrovascular atherosclerotic calcifications are noted. Skull: Negative for fracture or focal lesion. Old posttraumatic deformity of bilateral nasal bones. Sinuses/Orbits: Visualized portions of the orbits are unremarkable. Visualized portions of the paranasal sinuses are unremarkable. Visualized portions of the mastoid air cells are unremarkable. Other:  None. CT CERVICAL SPINE FINDINGS Alignment: Normal. Skull base and vertebrae: No acute fracture. No primary bone lesion or focal pathologic process. Mineralization along the posterior aspect of the dens as can be seen with a crystalline arthropathy. Soft tissues and spinal canal: No prevertebral fluid or swelling. No visible canal hematoma. Disc levels: Degenerative disease with disc height loss at C4-5 and C5-6. At C3-4 there is a small central disc protrusion. At C4-5 there is left uncovertebral degenerative changes mild left foraminal narrowing. At C5-6 there is a broad-based disc osteophyte complex, bilateral uncovertebral degenerative changes, and bilateral foraminal narrowing. Upper chest: Bilateral pleural effusions. Airspace disease in the left lung apex. Partially visualized large right pneumothorax. Other: Endotracheal tube with the tip in satisfactory position above the carina. IMPRESSION: 1. No acute intracranial pathology. 2. No acute osseous injury of the cervical spine. 3. Partially visualized large right hydropneumothorax. 4. Bilateral pleural effusions. Airspace disease in the left lung apex which may reflect pulmonary contusion or aspiration. Critical Value/emergent results were called by telephone at the time of interpretation on 06/17/2020 at 7:34 pm to provider [facility] , who verbally acknowledged these results. Electronically Signed   By: Kathreen Devoid   On: 06/28/2020 19:34   DG Pelvis Portable  Result Date: 06/14/2020 CLINICAL DATA:  Encounter for trauma, fall EXAM: PORTABLE PELVIS 1-2 VIEWS COMPARISON:  None. FINDINGS: There is no evidence of pelvic fracture or diastasis. Vascular calcifications. Catheter tubing overlies the central pelvis. IMPRESSION: No acute fracture or dislocation of the pelvis. Electronically Signed   By: Dahlia Bailiff MD   On: 06/26/2020 19:16   DG Chest Port 1 View  Result Date: 06/14/2020 CLINICAL DATA:  Acute respiratory failure. EXAM: PORTABLE CHEST  1 VIEW COMPARISON:  One-view chest x-ray 06/30/2020 FINDINGS: Heart size is normal. Endotracheal tube stable, in satisfactory position. OG tube courses off the inferior border the film. Right-sided chest tube is in place. Small apical pneumothorax measures 5% or less. Hazy opacities over the left lung likely represent edema. IMPRESSION: 1. Small right apical pneumothorax measures 5% or less, similar to the prior exam. 2. Stable support apparatus. 3. Hazy opacities over the left lung likely representing edema. Electronically Signed   By: San Morelle M.D.   On: 06/14/2020 05:16   DG Chest Port 1 View  Result Date: 06/16/2020 CLINICAL DATA:  Pneumothorax post  chest tube placement EXAM: PORTABLE CHEST 1 VIEW COMPARISON:  Same day CT chest and chest radiograph FINDINGS: Interval placement of a right chest pigtail catheter with decreased size of the now small right pneumothorax. Endotracheal tube with tip overlying the distal thoracic trachea. Nasogastric tube coursing below the diaphragm with side port overlying the stomach and tip obscured by collimation. The heart size and mediastinal contours appear unchanged. Left hemithorax is obscured by overlying defibrillator pads. Similar bilateral opacities. The visualized skeletal structures are unchanged. IMPRESSION: Interval placement of the right chest pigtail catheter with decreased size of the now small right pneumothorax. Electronically Signed   By: Dahlia Bailiff MD   On: 06/29/2020 21:09   DG Chest Portable 1 View  Result Date: 06/12/2020 CLINICAL DATA:  Post intubation encounter EXAM: PORTABLE CHEST 1 VIEW COMPARISON:  None. FINDINGS: Endotracheal tube with tip overlying the midthoracic trachea. Nasogastric tube coursing below the diaphragm with side port overlying the stomach and tip obscured by collimation. Left IJ central venous catheter with tip oriented in the left subclavian artery. Defibrillator leads overlie the left chest and mediastinum  obscuring view. The heart size and mediastinal contours are obscured. Bilateral interstitial and airspace opacities with the left chest predominantly obscured by defibrillator pads. IMPRESSION: 1. Left IJ central venous catheter with tip overlying the left subclavian artery. 2. Satisfactory positioning of the endotracheal tube and nasogastric tube. 3. Bilateral interstitial and airspace opacities with the left chest predominantly obscured by defibrillator pads. 4. Evaluation of mediastinum is obscured by defibrillator leads and material external to the patient. Electronically Signed   By: Dahlia Bailiff MD   On: 06/25/2020 19:14   ECHOCARDIOGRAM COMPLETE  Result Date: 06/14/2020    ECHOCARDIOGRAM REPORT   Patient Name:   Tracy Donaldson Date of Exam: 06/14/2020 Medical Rec #:  [id number]   Height:       63.0 in Accession #:    [id number]  Weight:       125.0 lb Date of Birth:  18-Dec-1929   BSA:          1.584 m Patient Age:    34 years    BP:           116/54 mmHg Patient Gender: F           HR:           102 bpm. Exam Location:  Inpatient Procedure: 2D Echo, Color Doppler, Cardiac Doppler and Intracardiac            Opacification Agent Indications:     Cardiac arrest  History:         Patient has no prior history of Echocardiogram examinations.                  Status post cardiac arrest. Patient is currently being cooled                  with targeted temperature management admitted to ICU. And                  having myoclonus movement during the echocardiogram.  Sonographer:     Merrie Roof RDCS Referring Phys:  [place] Diagnosing Phys: Rex Kras DO IMPRESSIONS  1. Left ventricular ejection fraction, by estimation, is 30 to 35%. The left ventricle has moderate to severely decreased function. The left ventricle demonstrates global hypokinesis. There is mild left ventricular hypertrophy. Left ventricular diastolic function could not be evaluated. There is hypokinesis  of the left ventricular, mid-apical  inferoseptal wall. There is hypokinesis of the left ventricular, mid-apical anteroseptal wall. There is hypokinesis of the left ventricular, mid-apical anterior wall.  2. Right ventricular systolic function is low normal. The right ventricular size is normal. Mildly increased right ventricular wall thickness. Tricuspid regurgitation signal is inadequate for assessing PA pressure.  3. Left atrial size was moderately dilated.  4. The mitral valve is degenerative. Mild mitral valve regurgitation. No evidence of mitral stenosis.  5. The aortic valve is tricuspid. Aortic valve regurgitation is not visualized. Mild to moderate aortic valve sclerosis/calcification is present, without any evidence of aortic stenosis. FINDINGS  Left Ventricle: Left ventricular ejection fraction, by estimation, is 30 to 35%. The left ventricle has moderate to severely decreased function. The left ventricle demonstrates global hypokinesis. Definity contrast agent was given IV to delineate the left ventricular endocardial borders. The left ventricular internal cavity size was normal in size. There is mild left ventricular hypertrophy. Left ventricular diastolic function could not be evaluated. Right Ventricle: The right ventricular size is normal. Mildly increased right ventricular wall thickness. Right ventricular systolic function is low normal. Tricuspid regurgitation signal is inadequate for assessing PA pressure. Left Atrium: Left atrial size was moderately dilated. Right Atrium: Right atrial size was normal in size. Pericardium: There is no evidence of pericardial effusion. Mitral Valve: The mitral valve is degenerative in appearance. There is mild thickening of the mitral valve leaflet(s). There is mild calcification of the mitral valve leaflet(s). Normal mobility of the mitral valve leaflets. Mild mitral annular calcification. Mild mitral valve regurgitation. No evidence of mitral valve stenosis. Tricuspid Valve: The tricuspid valve is  normal in structure. Tricuspid valve regurgitation is trivial. No evidence of tricuspid stenosis. Aortic Valve: The aortic valve is tricuspid. Aortic valve regurgitation is not visualized. Mild to moderate aortic valve sclerosis/calcification is present, without any evidence of aortic stenosis. Pulmonic Valve: The pulmonic valve was not well visualized. Pulmonic valve regurgitation is trivial. No evidence of pulmonic stenosis. Aorta: The aortic root is normal in size and structure. IAS/Shunts: The interatrial septum was not well visualized.  LEFT VENTRICLE PLAX 2D LVIDd:         4.20 cm     Diastology LVIDs:         3.60 cm     LV e' medial:  5.98 cm/s LV PW:         1.20 cm     LV e' lateral: 4.68 cm/s LV IVS:        1.20 cm LVOT diam:     1.80 cm LV SV:         33 LV SV Index:   21 LVOT Area:     2.54 cm  LV Volumes (MOD) LV vol d, MOD A2C: 76.2 ml LV vol d, MOD A4C: 78.0 ml LV vol s, MOD A2C: 56.5 ml LV vol s, MOD A4C: 48.3 ml LV SV MOD A2C:     19.7 ml LV SV MOD A4C:     78.0 ml LV SV MOD BP:      23.7 ml RIGHT VENTRICLE RV Basal diam:  2.60 cm LEFT ATRIUM             Index       RIGHT ATRIUM           Index LA diam:        3.60 cm 2.27 cm/m  RA Area:     12.30 cm LA Vol (A2C):  69.0 ml 43.57 ml/m RA Volume:   27.70 ml  17.49 ml/m LA Vol (A4C):   77.5 ml 48.94 ml/m LA Biplane Vol: 74.5 ml 47.05 ml/m  AORTIC VALVE LVOT Vmax:   76.40 cm/s LVOT Vmean:  52.600 cm/s LVOT VTI:    0.131 m  AORTA Ao Root diam: 3.30 cm  SHUNTS Systemic VTI:  0.13 m Systemic Diam: 1.80 cm Divine Hansley DO Electronically signed by Rex Kras DO Signature Date/Time: 06/14/2020/1:46:28 PM    Final    CT CHEST ABDOMEN PELVIS WO CONTRAST  Result Date: 07/02/2020 CLINICAL DATA:  Fall down 4 stairs as well as myocardial rest status post CPR EXAM: CT CHEST, ABDOMEN AND PELVIS WITHOUT CONTRAST TECHNIQUE: Multidetector CT imaging of the chest, abdomen and pelvis was performed following the standard protocol without IV contrast. COMPARISON:   Chest x-ray 07/02/2020, CT abdomen pelvis 07/14/2018 FINDINGS: CHEST: Ports and Devices: Enteric tube with tip and side port terminating within the gastric lumen. Endotracheal tube with tip approximately 1.5 cm above the carina. Urinary bladder Foley catheter terminates within the urinary bladder lumen. Foley is noted to loop within the urinary bladder. Lungs/airways: Bilateral lower lobe consolidations. Peribronchovascular ground-glass airspace opacities within the left upper lobe. No pulmonary mass. No defnite pulmonary contusion or laceration. No pneumatocele formation. The central airways are patent. Pleura: Bilateral trace pleural effusions. Moderate volume right pneumothorax. No hemothorax. Lymph Nodes: No mediastinal, hilar, or axillary lymphadenopathy. Mediastinum: No pneumomediastinum. No aortic injury or mediastinal hematoma. The thoracic aorta is normal in caliber. The heart is normal in size. No significant pericardial effusion. At least mild to moderate three-vessel coronary artery calcifications. The esophagus is unremarkable. The thyroid is unremarkable. Chest Wall / Breasts: No chest wall mass. Musculoskeletal: Minimally displaced acute right fourth and fifth costochondral junction fractures (6:19). Nondisplaced right anterior fourth and fifth rib fractures (3: 23-25). Minimally displaced right anterior sixth rib fracture. Minimally displaced acute anterior left third rib fracture (3:20). Minimally displaced fracture acute of the left fifth, sixth, seventh, eighth (3:20- 59)). Nondisplaced sternal fracture (7:82). Age-indeterminate minimal vertebral body height loss of the T6, T8, and T10 vertebral bodies. ABDOMEN / PELVIS: Liver: Streak artifact limits evaluation of the liver. Not enlarged. No focal lesion. Biliary System: The gallbladder is otherwise unremarkable with no radio-opaque gallstones. No biliary ductal dilatation. Pancreas: Normal pancreatic contour. No main pancreatic duct dilatation.  Spleen: Not enlarged. No focal lesion.   A splenule is noted. Adrenal Glands: No nodularity bilaterally. Kidneys: Nonspecific bilateral trace perinephric stranding. Bilateral kidneys enhance symmetrically. No hydronephrosis. No hydroureter. The urinary bladder is unremarkable. Bowel: No small or large bowel wall thickening or dilatation. No pneumatosis. Mesentery, Omentum, and Peritoneum: No simple free fluid ascites. No pneumoperitoneum. No hemoperitoneum. No mesenteric hematoma identified. No organized fluid collection. Pelvic Organs: Normal. Lymph Nodes: No abdominal, pelvic, inguinal lymphadenopathy. Vasculature: Extensive atherosclerotic plaque. No abdominal aorta or iliac aneurysm. No active contrast extravasation or pseudoaneurysm. Musculoskeletal: No significant soft tissue hematoma. No acute pelvic fracture. No spinal fracture. Multilevel degenerative changes of the spine with grade 1 anterolisthesis of L4 on L5 and multilevel intervertebral disc space vacuum phenomenon. IMPRESSION: 1. Moderate right pneumothorax and bilateral trace pleural effusions. 2. Bilateral peribronchovascular ground-glass airspace opacities and well as bilateral lower lobe consolidations that likely represent aspiration pneumonia. Followup PA and lateral chest X-ray is recommended in 3-4 weeks following therapy to ensure resolution and exclude underlying malignancy. 3. Bilateral minimally displaced rib fractures as well as right anterior third and fourth costochondral junction  fractures. 4. Minimally displaced sternal fracture. 5. Otherwise no acute intra-abdominal or intrapelvic abnormality; however, please note limited evaluation due to noncontrast study. 6. Age-indeterminate minimal T6, T8, and T10 compression fractures. Correlate with tenderness to palpation to evaluate for an acute component. 7.  Aortic Atherosclerosis (ICD10-I70.0) - extensive. 8. Endotracheal tube with tip approximately 1.5 cm above the carina. Consider  retracting by 1 cm. 9. Urinary bladder Foley catheter loops within the urinary bladder. Consider retracting. These results were called by telephone at the time of interpretation on 06/24/2020 at 7:31 pm to provider [facility] , who verbally acknowledged these results. Electronically Signed   By: Iven Finn M.D.   On: 07/07/2020 19:45    Cardiac database: EKG: 06/10/2020: Normal sinus rhythm, 96 bpm, left bundle branch block, ST-T changes in the inferior and lateral leads cannot exclude ischemia, without underlying injury pattern.  No prior EKGs for comparison.  Echocardiogram: 10/11/2018 :  Normal LV systolic function with visual EF 50-55%. Left ventricle cavity is normal in size. Normal global wall motion. Doppler evidence of grade I (impaired) diastolic dysfunction, elevated LAP.  Left atrial cavity is mildly dilated at 4.4 cm.  Mild (Grade I) mitral regurgitation.  06/14/2020: Status post cardiac arrest. Patient is currently being cooled with targeted temperature management admitted to ICU. And having myoclonus movement during the echocardiogram.   1. Left ventricular ejection fraction, by estimation, is 30 to 35%. The left ventricle has moderate to severely decreased function. The left ventricle demonstrates global hypokinesis. There is mild left ventricular hypertrophy. Left ventricular diastolic function could not be evaluated. There is hypokinesis of the left ventricular, mid-apical inferoseptal wall. There is hypokinesis of the left ventricular, mid-apical anteroseptal wall. There is hypokinesis of the left ventricular, mid-apical anterior wall.  2. Right ventricular systolic function is low normal. The right ventricular size is normal. Mildly increased right ventricular wall  thickness. Tricuspid regurgitation signal is inadequate for assessing PA pressure.  3. Left atrial size was moderately dilated.  4. The mitral valve is degenerative. Mild mitral valve regurgitation. No  evidence of mitral stenosis.  5. The aortic valve is tricuspid. Aortic valve regurgitation is not visualized. Mild to moderate aortic valve sclerosis/calcification is present, without any evidence of aortic stenosis.   Scheduled Meds: . chlorhexidine gluconate (MEDLINE KIT)  15 mL Mouth Rinse BID  . Chlorhexidine Gluconate Cloth  6 each Topical Q0600  . docusate  100 mg Per Tube BID  . heparin  5,000 Units Subcutaneous Q8H  . insulin aspart  0-20 Units Subcutaneous Q4H  . insulin detemir  8 Units Subcutaneous BID  . mouth rinse  15 mL Mouth Rinse 10 times per day  . multivitamin with minerals  1 tablet Per Tube Daily  . pantoprazole (PROTONIX) IV  40 mg Intravenous QHS  . polyethylene glycol  17 g Per Tube Daily  . sodium chloride flush  10 mL Intracatheter Q8H    Continuous Infusions: . sodium chloride 10 mL/hr at 06/14/20 0252  . feeding supplement (VITAL AF 1.2 CAL)    . lactated ringers 100 mL/hr at 06/14/20 1045  . levETIRAcetam Stopped (06/14/20 0307)  . piperacillin-tazobactam (ZOSYN)  IV Stopped (06/14/20 0708)  . propofol (DIPRIVAN) infusion 30 mcg/kg/min (06/14/20 1000)    PRN Meds: fentaNYL (SUBLIMAZE) injection, fentaNYL (SUBLIMAZE) injection, midazolam   IMPRESSION & RECOMMENDATIONS: Tracy Donaldson is a 87 y.o. female whose past medical history and cardiac risk factors include: Hypertension with chronic kidney disease, insulin-dependent diabetes mellitus type 2,  hyperlipidemia, left bundle branch block, postmenopausal female, advanced age.  Impression:  S/P PEA Cardiac Arrest  Elevated Troponin most-likely secondary to cardiac arrest and resuscitation efforts. Right-sided pneumothorax, status post chest tube placement Multiple rib fractures, sternal fracture.. Vent dependent respiratory failure Aortic atherosclerosis. Left bundle branch block Hypertension  Insulin-dependent diabetes mellitus type 2 Lactic acidosis: Improving status post cardiac arrest  Plan:   Patient presents to the hospital on 06/22/2020 status post cardiac arrest.  ROSC was achieved after 15 minutes of resuscitation initial rhythm was sinus tachycardia and then bradycardia requiring transcutaneous pacing.  Workup in ER was notable for multiple electrolyte normalities, lactic acidosis, severe hyperglycemia and imaging studies noted right-sided pneumothorax underwent chest tube placement by critical care medicine as well as multiple rib fractures and sternal fracture on imaging.  Transferred to ICU she was started on targeted temperature management due to her witnessed, out of the hospital, cardiac arrest. Since yesterday night patient remains in sinus rhythm predominantly sinus tachycardia.  She has not required resuscitation since seen in consult yesterday night in ER.  Patient is maintaining her pressures and has not required transcutaneous pacing.  Echocardiogram performed today notes moderately reduced left ventricular systolic function with global hypokinesis and regional wall motion abnormalities.  The regional wall motion normalities may be secondary to her left bundle branch block however underlying ischemia still cannot be ruled out entirely.  She may also have a component of stunned myocardium as she was resuscitated for 15 minutes and currently being cooled under the targeted temperature management protocol when the echo was performed.  She continues to have multiple metabolic abnormalities that are being managed by CCM (appreciate their efforts), hypoxia requiring vent support, undergoing EEG. At this point would recommend addressing goals of care and awaiting to see her neuro prognostication.  No invasive cardiovascular testing needed at this time.  Additional recommendations forthcoming depending on recovery.  Patient's daughter Janice Norrie) present at bedside was updated from a cardiovascular standpoint.  Prior to my morning rounds patient's CODE STATUS has been changed to DNR which  is very appropriate given her age, hospital presentation, comorbidities and sequelae of the recent course of events.   We will follow the patient with you peripherally.  Please do not hesitate to reach out if any questions or concerns arise.  Patient's questions and concerns were addressed to her satisfaction. She voices understanding of the instructions provided during this encounter.   This note was created using a voice recognition software as a result there may be grammatical errors inadvertently enclosed that do not reflect the nature of this encounter. Every attempt is made to correct such errors.  Rex Kras, [place], [facility]  Pager: [phone] Office: [phone] 06/14/2020, 2:18 PM

## 2020-06-14 NOTE — Progress Notes (Signed)
NAME:  Tracy Donaldson, MRN:  [id number], DOB:  1929-06-28, LOS: 1 ADMISSION DATE:  06/12/2020, CONSULTATION DATE:  06/17/2020 REFERRING MD:  DR Suzzanne Cloud, CHIEF COMPLAINT:  Acute cardiopulm arrest   Brief History:  87 year old woman with hypertension, diabetes type 2, chronic kidney disease (unknown stage), left bundle branch block.  PEA arrest 2/4, suffered fall down staircase peri-arrest.  CPR greater than 15 minutes.  Resuscitated to sinus bradycardia and required transcutaneous pacing, initiation epinephrine, intubation mechanical ventilation.   History of Present Illness:  87 year old woman with diabetes, hypertension with diastolic dysfunction, hyperlipidemia, left bundle branch block, chronic kidney disease (unknown stage).  She was apparently complaining of indigestion earlier in the day on 2/4.  Then she tripped and fell down 4 stairs and was unresponsive.  Unclear whether she was conscious when she fell or if this was periarrest.  CPR initiated immediately by family, PEA noted on EMS arrival.  ROSC after 15 minutes CPR on epinephrine infusion, 2 sinus tachycardia then bradycardia requiring transcutaneous pacing.  Intubated on arrival to the ED. trauma series CT scans ordered and pending, acute right pneumothorax noted on initial read and chest tube placed in the ED.  Past Medical History:   Past Medical History:  Diagnosis Date  . CHF (congestive heart failure) ([place])   . Chronic kidney disease   . Coronary artery disease   . Hyperlipidemia   . Hypertension     Significant Hospital Events:    Consults:  Cardiology 2/4 Trauma 2/4   Procedures:  ET tube 2/4 >>  Right pigtail chest tube 2/4 >>   Significant Diagnostic Tests:  Head CT 2/4 CT chest abdomen pelvis 2/4 CT cervical spine 2/4  Micro Data:  SARS CoV2 2/4 >> negative  Blood 2/4 >>  Urine 2/4 >> Respiratory culture 2/4 >>   Antimicrobials:  Zosyn 2/4 >>   Interim History / Subjective:  No events In status  myoclonus  Objective   Blood pressure (!) 116/54, pulse 96, temperature (!) 97.2 F (36.2 C), resp. rate (!) 27, height 5\' 3"  (1.6 m), weight 56.7 kg, SpO2 100 %.    Vent Mode: PRVC FiO2 (%):  [60 %-100 %] 60 % Set Rate:  [20 bmp-30 bmp] 30 bmp Vt Set:  [400 mL-410 mL] 410 mL PEEP:  [5 cmH20-12 cmH20] 12 cmH20 Plateau Pressure:  [19 cmH20-26 cmH20] 23 cmH20   Intake/Output Summary (Last 24 hours) at 06/14/2020 5974 Last data filed at 06/14/2020 1638 Gross per 24 hour  Intake 253.13 ml  Output 874 ml  Net -620.87 ml   Filed Weights   06/28/2020 1835  Weight: 56.7 kg    Examination: Constitutional: ill appearing woman jerking in bed  Eyes: pupils pinpoint, twitching of eyelids, not tracking Ears, nose, mouth, and throat: ETT in place with small thick secretions Cardiovascular: RRR, ext cool Respiratory: Mechanical breath sounds, triggers vent Gastrointestinal: soft, hypoactive BS Skin: No rashes, normal turgor Neurologic: jerkin ext, no response to pain Psychiatric: cannot assess Chest tube no air leak  Lactate remains mildly up Sugars improved on insulin drip WBC improved AM BMP pending CXR clear, R pigtail in place, minimal apical PTX Resolved Hospital Problem list     Assessment & Plan:  Acute PEA arrest, cause unclear.  Resuscitated to sinus bradycardia and required temporary pacing.   Hypoxemic respiratory failure post arrest- with presumed aspiration on vent R PTX, rib fx- from CPR vs. Fall, chest tube in place Presumed AKI- no recent labs available Lactic acidemia  post arrest improved Anoxic encephalopathy and myoclonus- initial head CT benign Severe hyperglycemia and pseudohyponatremia- improved with insulin gtt, likely from epi  - LTTV, VAP prevention bundle - TTM protocol, vEEG - Chest tube to suction - f/u echo - Prop for sedation/myoclonus, check EEG to see if any obvious anoxic seizures although usually tough to tell, may need AEDs - Zosyn x 7 days -  Palliative consult - Levemir, transition off insulin gtt  Best practice (evaluated daily)  Diet: TF Pain/Anxiety/Delirium protocol (if indicated): Propofol, fentanyl as needed VAP protocol (if indicated): 2/4 DVT prophylaxis: Heparin subcu GI prophylaxis: PPI Glucose control: levemir + ssi Mobility: BR Disposition: ICU  Goals of Care:  Last date of multidisciplinary goals of care discussion 2/4 Family and staff present: Dr Chapman Fitch NP. Discussed with patient's daughter by phone.  Summary of discussion: All aggressive medical care, no CPR, defib, pressors OK Follow up goals of care discussion due: 2/11 Code Status: partial code, no chest compressions   Patient critically ill due to respiratory failure post arrest Interventions to address this today vent titration, EEG, sedation titration Risk of deterioration without these interventions is high  I personally spent 39 minutes providing critical care not including any separately billable procedures  Erskine Emery MD [facility] 06/09/2020 7:23 AM Prefer epic messenger for cross cover needs If after hours, please call E-link

## 2020-06-14 NOTE — Procedures (Signed)
History: 87 year old female status post cardiac arrest  Sedation: Propofol 30 mcg/kg/min  Technique: This is a 21 channel routine scalp EEG performed at the bedside with bipolar and monopolar montages arranged in accordance to the international 10/20 system of electrode placement. One channel was dedicated to EKG recording.    Background: The background is diffusely attenuated with the exception of occasional bursts of epileptiform activity associated with generalized myoclonus on video.  The interburst interval is 25 to 30 seconds with a 1 to 2-second bursts of activity.  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  In the setting of anoxic brain injury, this pattern is highly correlated with poor prognosis.  Roland Rack, MD [facility] [phone]  If 7pm- 7am, please page neurology on call as listed in [place].

## 2020-06-15 ENCOUNTER — Inpatient Hospital Stay (HOSPITAL_COMMUNITY): Payer: Medicare Other

## 2020-06-15 DIAGNOSIS — I469 Cardiac arrest, cause unspecified: Secondary | ICD-10-CM | POA: Diagnosis not present

## 2020-06-15 DIAGNOSIS — J9601 Acute respiratory failure with hypoxia: Secondary | ICD-10-CM | POA: Diagnosis not present

## 2020-06-15 DIAGNOSIS — N171 Acute kidney failure with acute cortical necrosis: Secondary | ICD-10-CM | POA: Diagnosis not present

## 2020-06-15 LAB — CBC
HCT: 24 % — ABNORMAL LOW (ref 36.0–46.0)
Hemoglobin: 8.8 g/dL — ABNORMAL LOW (ref 12.0–15.0)
MCH: 31.3 pg (ref 26.0–34.0)
MCHC: 36.7 g/dL — ABNORMAL HIGH (ref 30.0–36.0)
MCV: 85.4 fL (ref 80.0–100.0)
Platelets: 170 10*3/uL (ref 150–400)
RBC: 2.81 MIL/uL — ABNORMAL LOW (ref 3.87–5.11)
RDW: 13.2 % (ref 11.5–15.5)
WBC: 16.7 10*3/uL — ABNORMAL HIGH (ref 4.0–10.5)
nRBC: 0 % (ref 0.0–0.2)

## 2020-06-15 LAB — BASIC METABOLIC PANEL
Anion gap: 12 (ref 5–15)
BUN: 58 mg/dL — ABNORMAL HIGH (ref 8–23)
CO2: 19 mmol/L — ABNORMAL LOW (ref 22–32)
Calcium: 8 mg/dL — ABNORMAL LOW (ref 8.9–10.3)
Chloride: 99 mmol/L (ref 98–111)
Creatinine, Ser: 1.99 mg/dL — ABNORMAL HIGH (ref 0.44–1.00)
GFR, Estimated: 24 mL/min — ABNORMAL LOW (ref >60)
Glucose, Bld: 111 mg/dL — ABNORMAL HIGH (ref 70–99)
Potassium: 4 mmol/L (ref 3.5–5.1)
Sodium: 130 mmol/L — ABNORMAL LOW (ref 135–145)

## 2020-06-15 LAB — MAGNESIUM: Magnesium: 1.8 mg/dL (ref 1.7–2.4)

## 2020-06-15 LAB — GLUCOSE, CAPILLARY
Glucose-Capillary: 115 mg/dL — ABNORMAL HIGH (ref 70–99)
Glucose-Capillary: 102 mg/dL — ABNORMAL HIGH (ref 70–99)

## 2020-06-15 LAB — PHOSPHORUS: Phosphorus: 4.2 mg/dL (ref 2.5–4.6)

## 2020-06-15 MED ORDER — POLYVINYL ALCOHOL 1.4 % OP SOLN
1.0000 [drp] | Freq: Four times a day (QID) | OPHTHALMIC | Status: DC | PRN
  Filled 2020-06-15: qty 15

## 2020-06-15 MED ORDER — GLYCOPYRROLATE 0.2 MG/ML IJ SOLN: 0.2000 mg | INTRAMUSCULAR | Status: DC | PRN

## 2020-06-15 MED ORDER — DIPHENHYDRAMINE HCL 50 MG/ML IJ SOLN: 25.0000 mg | INTRAMUSCULAR | Status: DC | PRN

## 2020-06-15 MED ORDER — GLYCOPYRROLATE 0.2 MG/ML IJ SOLN
0.2000 mg | INTRAMUSCULAR | Status: DC
  Administered 2020-06-15 (×2): 0.2 mg via INTRAVENOUS
  Filled 2020-06-15 (×2): qty 1

## 2020-06-15 MED ORDER — ACETAMINOPHEN 650 MG RE SUPP: 650.0000 mg | Freq: Four times a day (QID) | RECTAL | Status: DC | PRN

## 2020-06-15 MED ORDER — LORAZEPAM 2 MG/ML IJ SOLN
2.0000 mg | INTRAMUSCULAR | Status: DC | PRN
  Filled 2020-06-15: qty 1

## 2020-06-15 MED ORDER — DEXTROSE 5 % IV SOLN: INTRAVENOUS | Status: DC

## 2020-06-15 MED ORDER — ACETAMINOPHEN 325 MG PO TABS: 650.0000 mg | ORAL_TABLET | Freq: Four times a day (QID) | ORAL | Status: DC | PRN

## 2020-06-15 MED ORDER — GLYCOPYRROLATE 1 MG PO TABS
1.0000 mg | ORAL_TABLET | ORAL | Status: DC | PRN
  Filled 2020-06-15: qty 1

## 2020-06-15 MED ORDER — SODIUM CHLORIDE 0.9 % IV SOLN
0.5000 mg/h | INTRAVENOUS | Status: DC
  Administered 2020-06-15: 1 mg/h via INTRAVENOUS
  Filled 2020-06-15: qty 5

## 2020-06-18 LAB — CULTURE, BLOOD (ROUTINE X 2): Culture: NO GROWTH

## 2020-06-19 LAB — CULTURE, BLOOD (ROUTINE X 2): Culture: NO GROWTH

## 2020-07-08 NOTE — Death Summary Note (Signed)
DEATH SUMMARY   Patient Details  Name: Tracy Donaldson MRN: [id number] DOB: 06-23-1929  Admission/Discharge Information   Admit Date:  2020-06-22  Date of Death: Date of Death: 24-Jun-2020  Time of Death: Time of Death: 79  Length of Stay: 2  Referring Physician: Janie Morning, DO   Reason(s) for Hospitalization  Prolonged OOH cardiac arrest with fall CKD   Diagnoses  Preliminary cause of death:  Secondary Diagnoses (including complications and co-morbidities):  Active Problems:   Cardiac arrest (HCC)   Acute respiratory failure with hypoxia (HCC)   Lactic acidosis   Acute renal failure (ARF) (HCC)   Hyperkalemia   Hyponatremia   Diabetes mellitus [facility])   Brief Hospital Course (including significant findings, care, treatment, and services provided and events leading to death)  87 year old woman with diabetes, hypertension with diastolic dysfunction, hyperlipidemia, left bundle branch block, chronic kidney disease (unknown stage).  She was apparently complaining of indigestion earlier in the day on 2/4.  Then she tripped and fell down 4 stairs and was unresponsive.  Unclear whether she was conscious when she fell or if this was periarrest.  CPR initiated immediately by family, PEA noted on EMS arrival.  ROSC after 15 minutes CPR on epinephrine infusion, 2 sinus tachycardia then bradycardia requiring transcutaneous pacing.  Intubated on arrival to the ED. trauma series CT scans ordered and pending, acute right pneumothorax noted on initial read and chest tube placed in the ED.  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  EEG was essentially silent except for myoclonus.  Discussed quality vs. quantity of life with family who think that Tracy Donaldson would rather pass on her own terms.  She was extubated and made comfortable, passing away peacefully with family at bedside.  Pertinent Labs and Studies  Significant Diagnostic Studies CT HEAD WO CONTRAST  Result Date: 06-22-2020 CLINICAL DATA:   Poly trauma, critical head and C-spine injury. EXAM: CT HEAD WITHOUT CONTRAST CT CERVICAL SPINE WITHOUT CONTRAST TECHNIQUE: Multidetector CT imaging of the head and cervical spine was performed following the standard protocol without intravenous contrast. Multiplanar CT image reconstructions of the cervical spine were also generated. COMPARISON:  None. FINDINGS: CT HEAD FINDINGS Brain: No evidence of acute infarction, hemorrhage, extra-axial collection, ventriculomegaly, or mass effect. Generalized cerebral atrophy. Periventricular white matter low attenuation likely secondary to microangiopathy. Vascular: Cerebrovascular atherosclerotic calcifications are noted. Skull: Negative for fracture or focal lesion. Old posttraumatic deformity of bilateral nasal bones. Sinuses/Orbits: Visualized portions of the orbits are unremarkable. Visualized portions of the paranasal sinuses are unremarkable. Visualized portions of the mastoid air cells are unremarkable. Other: None. CT CERVICAL SPINE FINDINGS Alignment: Normal. Skull base and vertebrae: No acute fracture. No primary bone lesion or focal pathologic process. Mineralization along the posterior aspect of the dens as can be seen with a crystalline arthropathy. Soft tissues and spinal canal: No prevertebral fluid or swelling. No visible canal hematoma. Disc levels: Degenerative disease with disc height loss at C4-5 and C5-6. At C3-4 there is a small central disc protrusion. At C4-5 there is left uncovertebral degenerative changes mild left foraminal narrowing. At C5-6 there is a broad-based disc osteophyte complex, bilateral uncovertebral degenerative changes, and bilateral foraminal narrowing. Upper chest: Bilateral pleural effusions. Airspace disease in the left lung apex. Partially visualized large right pneumothorax. Other: Endotracheal tube with the tip in satisfactory position above the carina. IMPRESSION: 1. No acute intracranial pathology. 2. No acute osseous injury  of the cervical spine. 3. Partially visualized large right hydropneumothorax. 4. Bilateral pleural  effusions. Airspace disease in the left lung apex which may reflect pulmonary contusion or aspiration. Critical Value/emergent results were called by telephone at the time of interpretation on 06/30/2020 at 7:34 pm to provider [facility] , who verbally acknowledged these results. Electronically Signed   By: Kathreen Devoid   On: 06/17/2020 19:34   CT CERVICAL SPINE WO CONTRAST  Result Date: 07/05/2020 CLINICAL DATA:  Poly trauma, critical head and C-spine injury. EXAM: CT HEAD WITHOUT CONTRAST CT CERVICAL SPINE WITHOUT CONTRAST TECHNIQUE: Multidetector CT imaging of the head and cervical spine was performed following the standard protocol without intravenous contrast. Multiplanar CT image reconstructions of the cervical spine were also generated. COMPARISON:  None. FINDINGS: CT HEAD FINDINGS Brain: No evidence of acute infarction, hemorrhage, extra-axial collection, ventriculomegaly, or mass effect. Generalized cerebral atrophy. Periventricular white matter low attenuation likely secondary to microangiopathy. Vascular: Cerebrovascular atherosclerotic calcifications are noted. Skull: Negative for fracture or focal lesion. Old posttraumatic deformity of bilateral nasal bones. Sinuses/Orbits: Visualized portions of the orbits are unremarkable. Visualized portions of the paranasal sinuses are unremarkable. Visualized portions of the mastoid air cells are unremarkable. Other: None. CT CERVICAL SPINE FINDINGS Alignment: Normal. Skull base and vertebrae: No acute fracture. No primary bone lesion or focal pathologic process. Mineralization along the posterior aspect of the dens as can be seen with a crystalline arthropathy. Soft tissues and spinal canal: No prevertebral fluid or swelling. No visible canal hematoma. Disc levels: Degenerative disease with disc height loss at C4-5 and C5-6. At C3-4 there is a small central  disc protrusion. At C4-5 there is left uncovertebral degenerative changes mild left foraminal narrowing. At C5-6 there is a broad-based disc osteophyte complex, bilateral uncovertebral degenerative changes, and bilateral foraminal narrowing. Upper chest: Bilateral pleural effusions. Airspace disease in the left lung apex. Partially visualized large right pneumothorax. Other: Endotracheal tube with the tip in satisfactory position above the carina. IMPRESSION: 1. No acute intracranial pathology. 2. No acute osseous injury of the cervical spine. 3. Partially visualized large right hydropneumothorax. 4. Bilateral pleural effusions. Airspace disease in the left lung apex which may reflect pulmonary contusion or aspiration. Critical Value/emergent results were called by telephone at the time of interpretation on 07/07/2020 at 7:34 pm to provider [facility] , who verbally acknowledged these results. Electronically Signed   By: Kathreen Devoid   On: 06/28/2020 19:34   DG Pelvis Portable  Result Date: 06/24/2020 CLINICAL DATA:  Encounter for trauma, fall EXAM: PORTABLE PELVIS 1-2 VIEWS COMPARISON:  None. FINDINGS: There is no evidence of pelvic fracture or diastasis. Vascular calcifications. Catheter tubing overlies the central pelvis. IMPRESSION: No acute fracture or dislocation of the pelvis. Electronically Signed   By: Dahlia Bailiff MD   On: 06/29/2020 19:16   DG Chest Port 1 View  Addendum Date: 07/01/2020   ADDENDUM REPORT: 01-Jul-2020 08:22 ADDENDUM: Study discussed by telephone with Dr. Ina Homes on 07/01/2020 at 0819 hours. Electronically Signed   By: Genevie Ann M.D.   On: 01-Jul-2020 08:22   Result Date: July 01, 2020 CLINICAL DATA:  87 year old female status post fall down stairs with CPR, right hydropneumothorax. Chest tube placed. EXAM: PORTABLE CHEST 1 VIEW COMPARISON:  Portable chest 06/14/2020 and earlier. FINDINGS: Portable AP semi upright view at 0434 hours. Stable pigtail right chest tube. Residual  right pneumothorax is increased from yesterday, now moderate and with pleural edge visible at the costophrenic angle. There does also appear to be mild leftward shift of the mediastinum, but also reduced left lung volume  and dense left lower lobe opacity now. Stable endotracheal tube tip in good position between the clavicles and carina. Stable visible enteric tube. Mediastinal contours remain normal. IMPRESSION: 1. Stable chest tube but progressed right pneumothorax since yesterday, now moderate. 2. Worsening left lower lobe collapse or consolidation suspected. 3. Stable and satisfactory ET tube, enteric tube. Electronically Signed: By: Genevie Ann M.D. On: 06-20-2020 08:15   DG Chest Port 1 View  Result Date: 06/14/2020 CLINICAL DATA:  Acute respiratory failure. EXAM: PORTABLE CHEST 1 VIEW COMPARISON:  One-view chest x-ray 07/07/2020 FINDINGS: Heart size is normal. Endotracheal tube stable, in satisfactory position. OG tube courses off the inferior border the film. Right-sided chest tube is in place. Small apical pneumothorax measures 5% or less. Hazy opacities over the left lung likely represent edema. IMPRESSION: 1. Small right apical pneumothorax measures 5% or less, similar to the prior exam. 2. Stable support apparatus. 3. Hazy opacities over the left lung likely representing edema. Electronically Signed   By: San Morelle M.D.   On: 06/14/2020 05:16   DG Chest Port 1 View  Result Date: 06/19/2020 CLINICAL DATA:  Pneumothorax post chest tube placement EXAM: PORTABLE CHEST 1 VIEW COMPARISON:  Same day CT chest and chest radiograph FINDINGS: Interval placement of a right chest pigtail catheter with decreased size of the now small right pneumothorax. Endotracheal tube with tip overlying the distal thoracic trachea. Nasogastric tube coursing below the diaphragm with side port overlying the stomach and tip obscured by collimation. The heart size and mediastinal contours appear unchanged. Left hemithorax  is obscured by overlying defibrillator pads. Similar bilateral opacities. The visualized skeletal structures are unchanged. IMPRESSION: Interval placement of the right chest pigtail catheter with decreased size of the now small right pneumothorax. Electronically Signed   By: Dahlia Bailiff MD   On: 06/10/2020 21:09   DG Chest Portable 1 View  Result Date: 06/11/2020 CLINICAL DATA:  Post intubation encounter EXAM: PORTABLE CHEST 1 VIEW COMPARISON:  None. FINDINGS: Endotracheal tube with tip overlying the midthoracic trachea. Nasogastric tube coursing below the diaphragm with side port overlying the stomach and tip obscured by collimation. Left IJ central venous catheter with tip oriented in the left subclavian artery. Defibrillator leads overlie the left chest and mediastinum obscuring view. The heart size and mediastinal contours are obscured. Bilateral interstitial and airspace opacities with the left chest predominantly obscured by defibrillator pads. IMPRESSION: 1. Left IJ central venous catheter with tip overlying the left subclavian artery. 2. Satisfactory positioning of the endotracheal tube and nasogastric tube. 3. Bilateral interstitial and airspace opacities with the left chest predominantly obscured by defibrillator pads. 4. Evaluation of mediastinum is obscured by defibrillator leads and material external to the patient. Electronically Signed   By: Dahlia Bailiff MD   On: 07/01/2020 19:14   EEG adult  Result Date: 06/14/2020 Greta Doom, MD     06/14/2020  3:41 PM History: 87 year old female status post cardiac arrest Sedation: Propofol 30 mcg/kg/min Technique: This is a 21 channel routine scalp EEG performed at the bedside with bipolar and monopolar montages arranged in accordance to the international 10/20 system of electrode placement. One channel was dedicated to EKG recording. Background: The background is diffusely attenuated with the exception of occasional bursts of epileptiform  activity associated with generalized myoclonus on video.  The interburst interval is 25 to 30 seconds with a 1 to 2-second bursts of activity. 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  In the setting of anoxic brain injury, this pattern is highly correlated with poor prognosis. Roland Rack, MD [facility] [phone] If 7pm- 7am, please page neurology on call as listed in [place].   Overnight EEG with video  Result Date: 07/14/2020 Lora Havens, MD     06/16/2020 10:04 AM Patient Name: Tracy Donaldson Epilepsy Attending: Lora Havens Referring Physician/Provider: Dr [name] Duration: 06/14/2020 1059 to July 14, 2020 1236 Patient history: 87yo F s/p cardiac arrest. EEG to evaluate for seizure Level of alertness: comatose AEDs during EEG study: Propofol, LEV Technical aspects: This EEG study was done with scalp electrodes positioned according to the 10-20 International system of electrode placement. Electrical activity was acquired at a sampling rate of 500Hz  and reviewed with a high frequency filter of 70Hz  and a low frequency filter of 1Hz . EEG data were recorded continuously and digitally stored. Description: EEG showed burst suppression pattern. Initially bursts were noted every 30-60 seconds , lasting 1-2 seconds. Gradually bursts became more frequent at every 5-7 seconds, lasting 2-4seconds. During bursts patient was noted to have brief generalized myoclonus Hyperventilation and photic stimulation were not performed.   ABNORMALITY - Myoclonic seizure, generalized - Burst suppression, generalized IMPRESSION: This study showed myoclonic seizure which were initially noted every 30-60 seconds but gradually became more frequent and were noted every 5-7 seconds consistent with myoclonic status epilepticus.  There is also profound diffuse encephalopathy. In setting of cardiac arrest this is suggestive of severe neurologic injury with poor prognosis. Lora Havens   ECHOCARDIOGRAM COMPLETE  Result Date: 06/14/2020    ECHOCARDIOGRAM REPORT   Patient Name:   Tracy Donaldson Date of Exam: 06/14/2020 Medical Rec #:  [id number]   Height:       63.0 in Accession #:    [id number]  Weight:       125.0 lb Date of Birth:  May 20, 1929   BSA:          1.584 m Patient Age:    12 years    BP:           116/54 mmHg Patient Gender: F           HR:           102 bpm. Exam Location:  Inpatient Procedure: 2D Echo, Color Doppler, Cardiac Doppler and Intracardiac            Opacification Agent Indications:     Cardiac arrest  History:         Patient has no prior history of Echocardiogram examinations.                  Status post cardiac arrest. Patient is currently being cooled                  with targeted temperature management admitted to ICU. And                  having myoclonus movement during the echocardiogram.  Sonographer:     Merrie Roof RDCS Referring Phys:  [place] Diagnosing Phys: Rex Kras DO IMPRESSIONS  1. Left ventricular ejection fraction, by estimation, is 30 to 35%. The left ventricle has moderate to severely decreased function. The left ventricle demonstrates global hypokinesis. There is mild left ventricular hypertrophy. Left ventricular diastolic function could not be evaluated. There is hypokinesis of the left ventricular, mid-apical inferoseptal  wall. There is hypokinesis of the left ventricular, mid-apical anteroseptal wall. There is hypokinesis of the left ventricular, mid-apical anterior wall.  2. Right ventricular systolic function is low normal. The right ventricular size is normal. Mildly increased right ventricular wall thickness. Tricuspid regurgitation signal is inadequate for assessing PA pressure.  3. Left atrial size was moderately dilated.  4. The mitral valve is degenerative. Mild mitral  valve regurgitation. No evidence of mitral stenosis.  5. The aortic valve is tricuspid. Aortic valve regurgitation is not visualized. Mild to moderate aortic valve sclerosis/calcification is present, without any evidence of aortic stenosis. FINDINGS  Left Ventricle: Left ventricular ejection fraction, by estimation, is 30 to 35%. The left ventricle has moderate to severely decreased function. The left ventricle demonstrates global hypokinesis. Definity contrast agent was given IV to delineate the left ventricular endocardial borders. The left ventricular internal cavity size was normal in size. There is mild left ventricular hypertrophy. Left ventricular diastolic function could not be evaluated. Right Ventricle: The right ventricular size is normal. Mildly increased right ventricular wall thickness. Right ventricular systolic function is low normal. Tricuspid regurgitation signal is inadequate for assessing PA pressure. Left Atrium: Left atrial size was moderately dilated. Right Atrium: Right atrial size was normal in size. Pericardium: There is no evidence of pericardial effusion. Mitral Valve: The mitral valve is degenerative in appearance. There is mild thickening of the mitral valve leaflet(s). There is mild calcification of the mitral valve leaflet(s). Normal mobility of the mitral valve leaflets. Mild mitral annular calcification. Mild mitral valve regurgitation. No evidence of mitral valve stenosis. Tricuspid Valve: The tricuspid valve is normal in structure. Tricuspid valve regurgitation is trivial. No evidence of tricuspid stenosis. Aortic Valve: The aortic valve is tricuspid. Aortic valve regurgitation is not visualized. Mild to moderate aortic valve sclerosis/calcification is present, without any evidence of aortic stenosis. Pulmonic Valve: The pulmonic valve was not well visualized. Pulmonic valve regurgitation is trivial. No evidence of pulmonic stenosis. Aorta: The aortic root is normal in size and  structure. IAS/Shunts: The interatrial septum was not well visualized.  LEFT VENTRICLE PLAX 2D LVIDd:         4.20 cm     Diastology LVIDs:         3.60 cm     LV e' medial:  5.98 cm/s LV PW:         1.20 cm     LV e' lateral: 4.68 cm/s LV IVS:        1.20 cm LVOT diam:     1.80 cm LV SV:         33 LV SV Index:   21 LVOT Area:     2.54 cm  LV Volumes (MOD) LV vol d, MOD A2C: 76.2 ml LV vol d, MOD A4C: 78.0 ml LV vol s, MOD A2C: 56.5 ml LV vol s, MOD A4C: 48.3 ml LV SV MOD A2C:     19.7 ml LV SV MOD A4C:     78.0 ml LV SV MOD BP:      23.7 ml RIGHT VENTRICLE RV Basal diam:  2.60 cm LEFT ATRIUM             Index       RIGHT ATRIUM           Index LA diam:        3.60 cm 2.27 cm/m  RA Area:     12.30 cm LA Vol (A2C):   69.0 ml 43.57 ml/m RA  Volume:   27.70 ml  17.49 ml/m LA Vol (A4C):   77.5 ml 48.94 ml/m LA Biplane Vol: 74.5 ml 47.05 ml/m  AORTIC VALVE LVOT Vmax:   76.40 cm/s LVOT Vmean:  52.600 cm/s LVOT VTI:    0.131 m  AORTA Ao Root diam: 3.30 cm  SHUNTS Systemic VTI:  0.13 m Systemic Diam: 1.80 cm Sunit Tolia DO Electronically signed by Rex Kras DO Signature Date/Time: 06/14/2020/1:46:28 PM    Final    CT CHEST ABDOMEN PELVIS WO CONTRAST  Result Date: 07/02/2020 CLINICAL DATA:  Fall down 4 stairs as well as myocardial rest status post CPR EXAM: CT CHEST, ABDOMEN AND PELVIS WITHOUT CONTRAST TECHNIQUE: Multidetector CT imaging of the chest, abdomen and pelvis was performed following the standard protocol without IV contrast. COMPARISON:  Chest x-ray 06/20/2020, CT abdomen pelvis 07/14/2018 FINDINGS: CHEST: Ports and Devices: Enteric tube with tip and side port terminating within the gastric lumen. Endotracheal tube with tip approximately 1.5 cm above the carina. Urinary bladder Foley catheter terminates within the urinary bladder lumen. Foley is noted to loop within the urinary bladder. Lungs/airways: Bilateral lower lobe consolidations. Peribronchovascular ground-glass airspace opacities within the left  upper lobe. No pulmonary mass. No defnite pulmonary contusion or laceration. No pneumatocele formation. The central airways are patent. Pleura: Bilateral trace pleural effusions. Moderate volume right pneumothorax. No hemothorax. Lymph Nodes: No mediastinal, hilar, or axillary lymphadenopathy. Mediastinum: No pneumomediastinum. No aortic injury or mediastinal hematoma. The thoracic aorta is normal in caliber. The heart is normal in size. No significant pericardial effusion. At least mild to moderate three-vessel coronary artery calcifications. The esophagus is unremarkable. The thyroid is unremarkable. Chest Wall / Breasts: No chest wall mass. Musculoskeletal: Minimally displaced acute right fourth and fifth costochondral junction fractures (6:19). Nondisplaced right anterior fourth and fifth rib fractures (3: 23-25). Minimally displaced right anterior sixth rib fracture. Minimally displaced acute anterior left third rib fracture (3:20). Minimally displaced fracture acute of the left fifth, sixth, seventh, eighth (3:20- 59)). Nondisplaced sternal fracture (7:82). Age-indeterminate minimal vertebral body height loss of the T6, T8, and T10 vertebral bodies. ABDOMEN / PELVIS: Liver: Streak artifact limits evaluation of the liver. Not enlarged. No focal lesion. Biliary System: The gallbladder is otherwise unremarkable with no radio-opaque gallstones. No biliary ductal dilatation. Pancreas: Normal pancreatic contour. No main pancreatic duct dilatation. Spleen: Not enlarged. No focal lesion.   A splenule is noted. Adrenal Glands: No nodularity bilaterally. Kidneys: Nonspecific bilateral trace perinephric stranding. Bilateral kidneys enhance symmetrically. No hydronephrosis. No hydroureter. The urinary bladder is unremarkable. Bowel: No small or large bowel wall thickening or dilatation. No pneumatosis. Mesentery, Omentum, and Peritoneum: No simple free fluid ascites. No pneumoperitoneum. No hemoperitoneum. No mesenteric  hematoma identified. No organized fluid collection. Pelvic Organs: Normal. Lymph Nodes: No abdominal, pelvic, inguinal lymphadenopathy. Vasculature: Extensive atherosclerotic plaque. No abdominal aorta or iliac aneurysm. No active contrast extravasation or pseudoaneurysm. Musculoskeletal: No significant soft tissue hematoma. No acute pelvic fracture. No spinal fracture. Multilevel degenerative changes of the spine with grade 1 anterolisthesis of L4 on L5 and multilevel intervertebral disc space vacuum phenomenon. IMPRESSION: 1. Moderate right pneumothorax and bilateral trace pleural effusions. 2. Bilateral peribronchovascular ground-glass airspace opacities and well as bilateral lower lobe consolidations that likely represent aspiration pneumonia. Followup PA and lateral chest X-ray is recommended in 3-4 weeks following therapy to ensure resolution and exclude underlying malignancy. 3. Bilateral minimally displaced rib fractures as well as right anterior third and fourth costochondral junction fractures. 4. Minimally displaced sternal  fracture. 5. Otherwise no acute intra-abdominal or intrapelvic abnormality; however, please note limited evaluation due to noncontrast study. 6. Age-indeterminate minimal T6, T8, and T10 compression fractures. Correlate with tenderness to palpation to evaluate for an acute component. 7.  Aortic Atherosclerosis (ICD10-I70.0) - extensive. 8. Endotracheal tube with tip approximately 1.5 cm above the carina. Consider retracting by 1 cm. 9. Urinary bladder Foley catheter loops within the urinary bladder. Consider retracting. These results were called by telephone at the time of interpretation on 07/05/2020 at 7:31 pm to provider [facility] , who verbally acknowledged these results. Electronically Signed   By: Iven Finn M.D.   On: 07/02/2020 19:45    Microbiology Recent Results (from the past 240 hour(s))  SARS Coronavirus 2 by RT PCR (hospital order, performed in [facility] hospital lab) Nasopharyngeal Nasopharyngeal Swab     Status: None   Collection Time: 06/14/2020  6:09 PM   Specimen: Nasopharyngeal Swab  Result Value Ref Range Status   SARS Coronavirus 2 NEGATIVE NEGATIVE Final    Comment: (NOTE) SARS-CoV-2 target nucleic acids are NOT DETECTED.  The SARS-CoV-2 RNA is generally detectable in upper and lower respiratory specimens during the acute phase of infection. The lowest concentration of SARS-CoV-2 viral copies this assay can detect is 250 copies / mL. A negative result does not preclude SARS-CoV-2 infection and should not be used as the sole basis for treatment or other patient management decisions.  A negative result may occur with improper specimen collection / handling, submission of specimen other than nasopharyngeal swab, presence of viral mutation(s) within the areas targeted by this assay, and inadequate number of viral copies (<250 copies / mL). A negative result must be combined with clinical observations, patient history, and epidemiological information.  Fact Sheet for Patients:   StrictlyIdeas.no  Fact Sheet for Healthcare Providers: BankingDealers.co.za  This test is not yet approved or  cleared by the [place] FDA and has been authorized for detection and/or diagnosis of SARS-CoV-2 by FDA under an Emergency Use Authorization (EUA).  This EUA will remain in effect (meaning this test can be used) for the duration of the COVID-19 declaration under Section 564(b)(1) of the Act, 21 U.S.C. section 360bbb-3(b)(1), unless the authorization is terminated or revoked sooner.  Performed at [facility], [place] [address] [place], [place] [address]   Culture, blood (Routine X 2) w Reflex to ID Panel     Status: None   Collection Time: 07/01/2020  9:29 PM   Specimen: BLOOD  Result Value Ref Range Status   Specimen Description BLOOD SITE NOT SPECIFIED  Final   Special Requests    Final    AEROBIC BOTTLE ONLY Blood Culture results may not be optimal due to an inadequate volume of blood received in culture bottles   Culture   Final    NO GROWTH 5 DAYS Performed at [facility], [place] [address], [place], [place] [address]    Report Status 06/18/2020 FINAL  Final  MRSA PCR Screening     Status: None   Collection Time: 06/14/20  2:49 AM   Specimen: Nasopharyngeal  Result Value Ref Range Status   MRSA by PCR NEGATIVE NEGATIVE Final    Comment:        The GeneXpert MRSA Assay (FDA approved for NASAL specimens only), is one component of a comprehensive MRSA colonization surveillance program. It is not intended to diagnose MRSA infection nor to guide or monitor treatment for MRSA infections. Performed at  [facility], [place] [address], [place], [place] [address]   Culture, blood (Routine X 2) w Reflex to ID Panel     Status: None   Collection Time: 06/14/20  3:02 AM   Specimen: BLOOD LEFT HAND  Result Value Ref Range Status   Specimen Description BLOOD LEFT HAND  Final   Special Requests   Final    AEROBIC BOTTLE ONLY Blood Culture results may not be optimal due to an inadequate volume of blood received in culture bottles   Culture   Final    NO GROWTH 5 DAYS Performed at [facility], [place] [address] [place], [place] [address]    Report Status 06/19/2020 FINAL  Final    Lab Basic Metabolic Panel: No results for input(s): NA, K, CL, CO2, GLUCOSE, BUN, CREATININE, CALCIUM, MG, PHOS in the last 168 hours. Liver Function Tests: No results for input(s): AST, ALT, ALKPHOS, BILITOT, PROT, ALBUMIN in the last 168 hours. No results for input(s): LIPASE, AMYLASE in the last 168 hours. No results for input(s): AMMONIA in the last 168 hours. CBC: No results for input(s): WBC, NEUTROABS, HGB, HCT, MCV, PLT in the last 168 hours. Cardiac Enzymes: No results for input(s): CKTOTAL, CKMB, CKMBINDEX, TROPONINI in the last 168 hours. Sepsis Labs: No  results for input(s): PROCALCITON, WBC, LATICACIDVEN in the last 168 hours.   Candee Furbish 06/23/2020, 5:57 PM

## 2020-07-08 NOTE — Progress Notes (Signed)
LTM EEG discontinued - no skin breakdown at [facility]. [facility] notified of unhook.

## 2020-07-08 NOTE — Procedures (Addendum)
Patient Name: Tracy Donaldson  MRN: [id number]  Epilepsy Attending: Lora Havens  Referring Physician/Provider: Dr [name] Duration: 06/14/2020 1059 to 22-Jun-2020 1236  Patient history: 87yo F s/p cardiac arrest. EEG to evaluate for seizure  Level of alertness: comatose  AEDs during EEG study: Propofol, LEV  Technical aspects: This EEG study was done with scalp electrodes positioned according to the 10-20 International system of electrode placement. Electrical activity was acquired at a sampling rate of 500Hz  and reviewed with a high frequency filter of 70Hz  and a low frequency filter of 1Hz . EEG data were recorded continuously and digitally stored.   Description: EEG showed burst suppression pattern. Initially bursts were noted every 30-60 seconds , lasting 1-2 seconds. Gradually bursts became more frequent at every 5-7 seconds, lasting 2-4seconds. During bursts patient was noted to have brief generalized myoclonus Hyperventilation and photic stimulation were not performed.     ABNORMALITY - Myoclonic seizure, generalized - Burst suppression, generalized  IMPRESSION: This study showed myoclonic seizure which were initially noted every 30-60 seconds but gradually became more frequent and were noted every 5-7 seconds consistent with myoclonic status epilepticus. There is also profound diffuse encephalopathy. In setting of cardiac arrest this is suggestive of severe neurologic injury with poor prognosis.   Cheree Fowles Barbra Sarks

## 2020-07-08 NOTE — Significant Event (Signed)
Patient time of death occurred at 32.   Pronounced by Alleen Borne, RN and Candiss Norse, RN.  Dr. Tamala Julian present at the bedside.

## 2020-07-08 NOTE — Progress Notes (Signed)
NAME:  Tracy Donaldson, MRN:  [id number], DOB:  May 26, 1929, LOS: 2 ADMISSION DATE:  06/12/2020, CONSULTATION DATE:  06/28/2020 REFERRING MD:  DR Suzzanne Cloud, CHIEF COMPLAINT:  Acute cardiopulm arrest   Brief History:  87 year old woman with hypertension, diabetes type 2, chronic kidney disease (unknown stage), left bundle branch block.  PEA arrest 2/4, suffered fall down staircase peri-arrest.  CPR greater than 15 minutes.  Resuscitated to sinus bradycardia and required transcutaneous pacing, initiation epinephrine, intubation mechanical ventilation.   History of Present Illness:  87 year old woman with diabetes, hypertension with diastolic dysfunction, hyperlipidemia, left bundle branch block, chronic kidney disease (unknown stage).  She was apparently complaining of indigestion earlier in the day on 2/4.  Then she tripped and fell down 4 stairs and was unresponsive.  Unclear whether she was conscious when she fell or if this was periarrest.  CPR initiated immediately by family, PEA noted on EMS arrival.  ROSC after 15 minutes CPR on epinephrine infusion, 2 sinus tachycardia then bradycardia requiring transcutaneous pacing.  Intubated on arrival to the ED. trauma series CT scans ordered and pending, acute right pneumothorax noted on initial read and chest tube placed in the ED.  Past Medical History:   Past Medical History:  Diagnosis Date  . CHF (congestive heart failure) ([place])   . Chronic kidney disease   . Coronary artery disease   . Hyperlipidemia   . Hypertension     Significant Hospital Events:    Consults:  Cardiology 2/4 Trauma 2/4   Procedures:  ET tube 2/4 >>  Right pigtail chest tube 2/4 >>   Significant Diagnostic Tests:  Head CT 2/4 CT chest abdomen pelvis 2/4 CT cervical spine 2/4  Micro Data:  SARS CoV2 2/4 >> negative  Blood 2/4 >>  Urine 2/4 >> Respiratory culture 2/4 >>   Antimicrobials:  Zosyn 2/4 >>   Interim History / Subjective:  No events Remains in status  myoclonus, EEG unchanged.  Objective   Blood pressure (!) 121/105, pulse (!) 102, temperature 98.4 F (36.9 C), temperature source Bladder, resp. rate (!) 30, height 5\' 3"  (1.6 m), weight 56.7 kg, SpO2 100 %.    Vent Mode: PRVC FiO2 (%):  [40 %-60 %] 40 % Set Rate:  [30 bmp] 30 bmp Vt Set:  [410 mL] 410 mL PEEP:  [12 cmH20] 12 cmH20 Plateau Pressure:  [21 cmH20-25 cmH20] 25 cmH20   Intake/Output Summary (Last 24 hours) at June 28, 2020 1000 Last data filed at 06-28-20 0900 Gross per 24 hour  Intake 3171.26 ml  Output 875 ml  Net 2296.26 ml   Filed Weights   07/01/2020 1835  Weight: 56.7 kg    Examination: Constitutional: ill appearing woman jerking in bed  Eyes: pupils pinpoint, twitching of eyelids, not tracking Ears, nose, mouth, and throat: ETT in place with less thick secretions Cardiovascular: RRR, ext cool Respiratory: Mechanical breath sounds, triggers vent Gastrointestinal: soft, hypoactive BS Skin: No rashes, normal turgor Neurologic: ext with no response to pain, diffuse myoclonus reduced with propofol, reducing propofol increases myoclonus Psychiatric: cannot assess Chest tube 1+ air leak, suspect insertion site with kinking from cooling pads  CXR a bit worse for ptx, no mediastinal shift AKI stable WBC improved H/H slightly down Plts a bit down  Resolved Hospital Problem list     Assessment & Plan:  Acute PEA arrest, cause unclear.  Resuscitated to sinus bradycardia and required temporary pacing.   Hypoxemic respiratory failure post arrest- with presumed aspiration on vent R PTX,  rib fx- from CPR vs. Fall, chest tube in place Presumed AKI- no recent labs available Lactic acidemia post arrest improved Anoxic encephalopathy and myoclonus- initial head CT benign Severe hyperglycemia- resolved  - Family coming in at [facility] to discuss transition to comfort care - DC EEG - Continue vent support in interim - Expect in hospital death   Patient critically ill due  to respiratory failure, post cardiac arrest Interventions to address this today vent titration, family discussions Risk of deterioration without these interventions is high  I personally spent 32 minutes providing critical care not including any separately billable procedures  Erskine Emery MD [facility] 06/09/2020 7:23 AM Prefer epic messenger for cross cover needs If after hours, please call E-link

## 2020-07-08 NOTE — Progress Notes (Signed)
RT extubated patient to comfort per MD order and family wishes with RN at bedside. Family currently bedside.

## 2020-07-08 DEATH — deceased

## 2020-12-04 ENCOUNTER — Encounter (INDEPENDENT_AMBULATORY_CARE_PROVIDER_SITE_OTHER): Payer: Medicare Other | Admitting: Ophthalmology

## 2022-06-08 IMAGING — DX DG CHEST 1V PORT
1 series · 1 of 1 positions shown · non-contrast
Comparison: Same day CT chest and chest radiograph

CLINICAL DATA: Pneumothorax post chest tube placement

EXAM:
PORTABLE CHEST 1 VIEW

[chest ap]
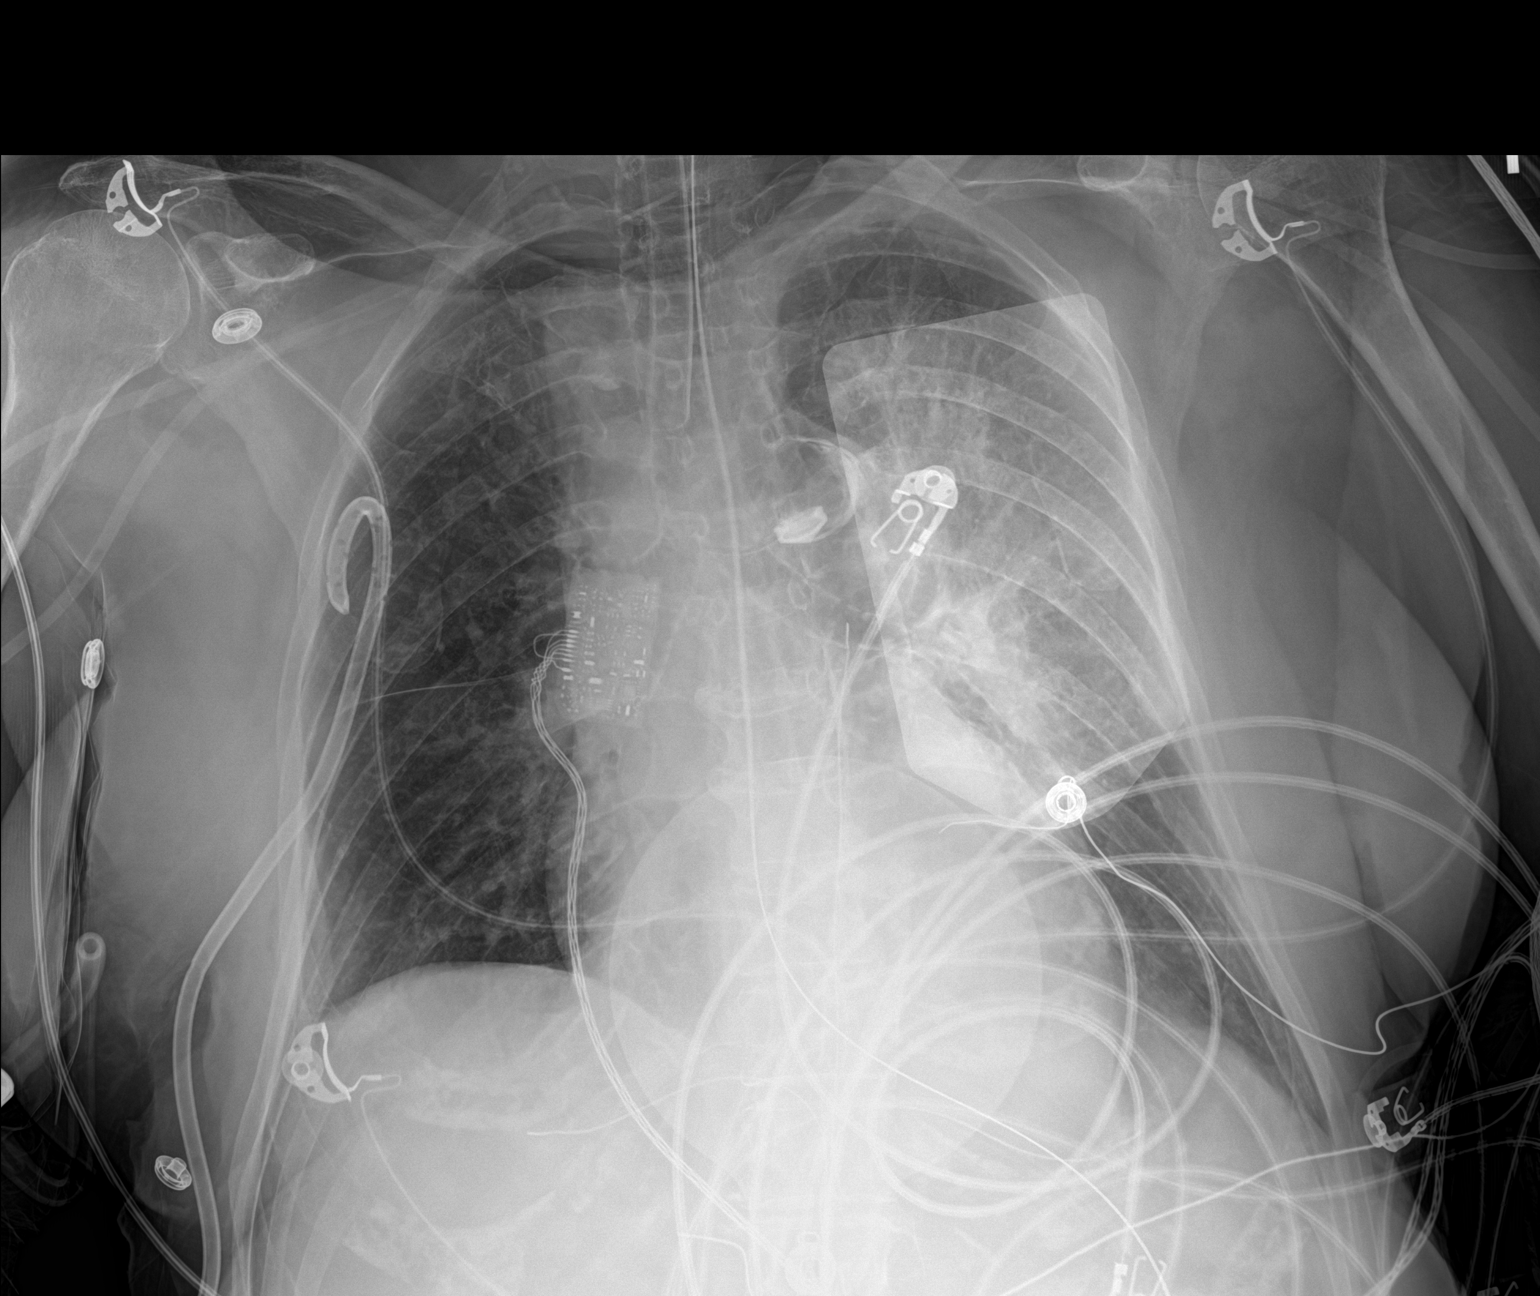

[1 of 1 positions shown; findings below may reference images not displayed]

FINDINGS: Interval placement of a right chest pigtail catheter with decreased
size of the now small right pneumothorax. Endotracheal tube with tip
overlying the distal thoracic trachea. Nasogastric tube coursing
below the diaphragm with side port overlying the stomach and tip
obscured by collimation. The heart size and mediastinal contours
appear unchanged. Left hemithorax is obscured by overlying
defibrillator pads. Similar bilateral opacities. The visualized
skeletal structures are unchanged.
IMPRESSION: Interval placement of the right chest pigtail catheter with
decreased size of the now small right pneumothorax.

## 2022-06-10 IMAGING — DX DG CHEST 1V PORT
1 series · 1 of 1 positions shown · non-contrast
Comparison: Portable chest 06/14/2020 and earlier.
COMPARISON: Portable chest 06/14/2020 and earlier.

Addendum:
CLINICAL DATA: 89-year-old female status post fall down stairs with
CPR, right hydropneumothorax. Chest tube placed.

EXAM:
PORTABLE CHEST 1 VIEW

[chest ap]
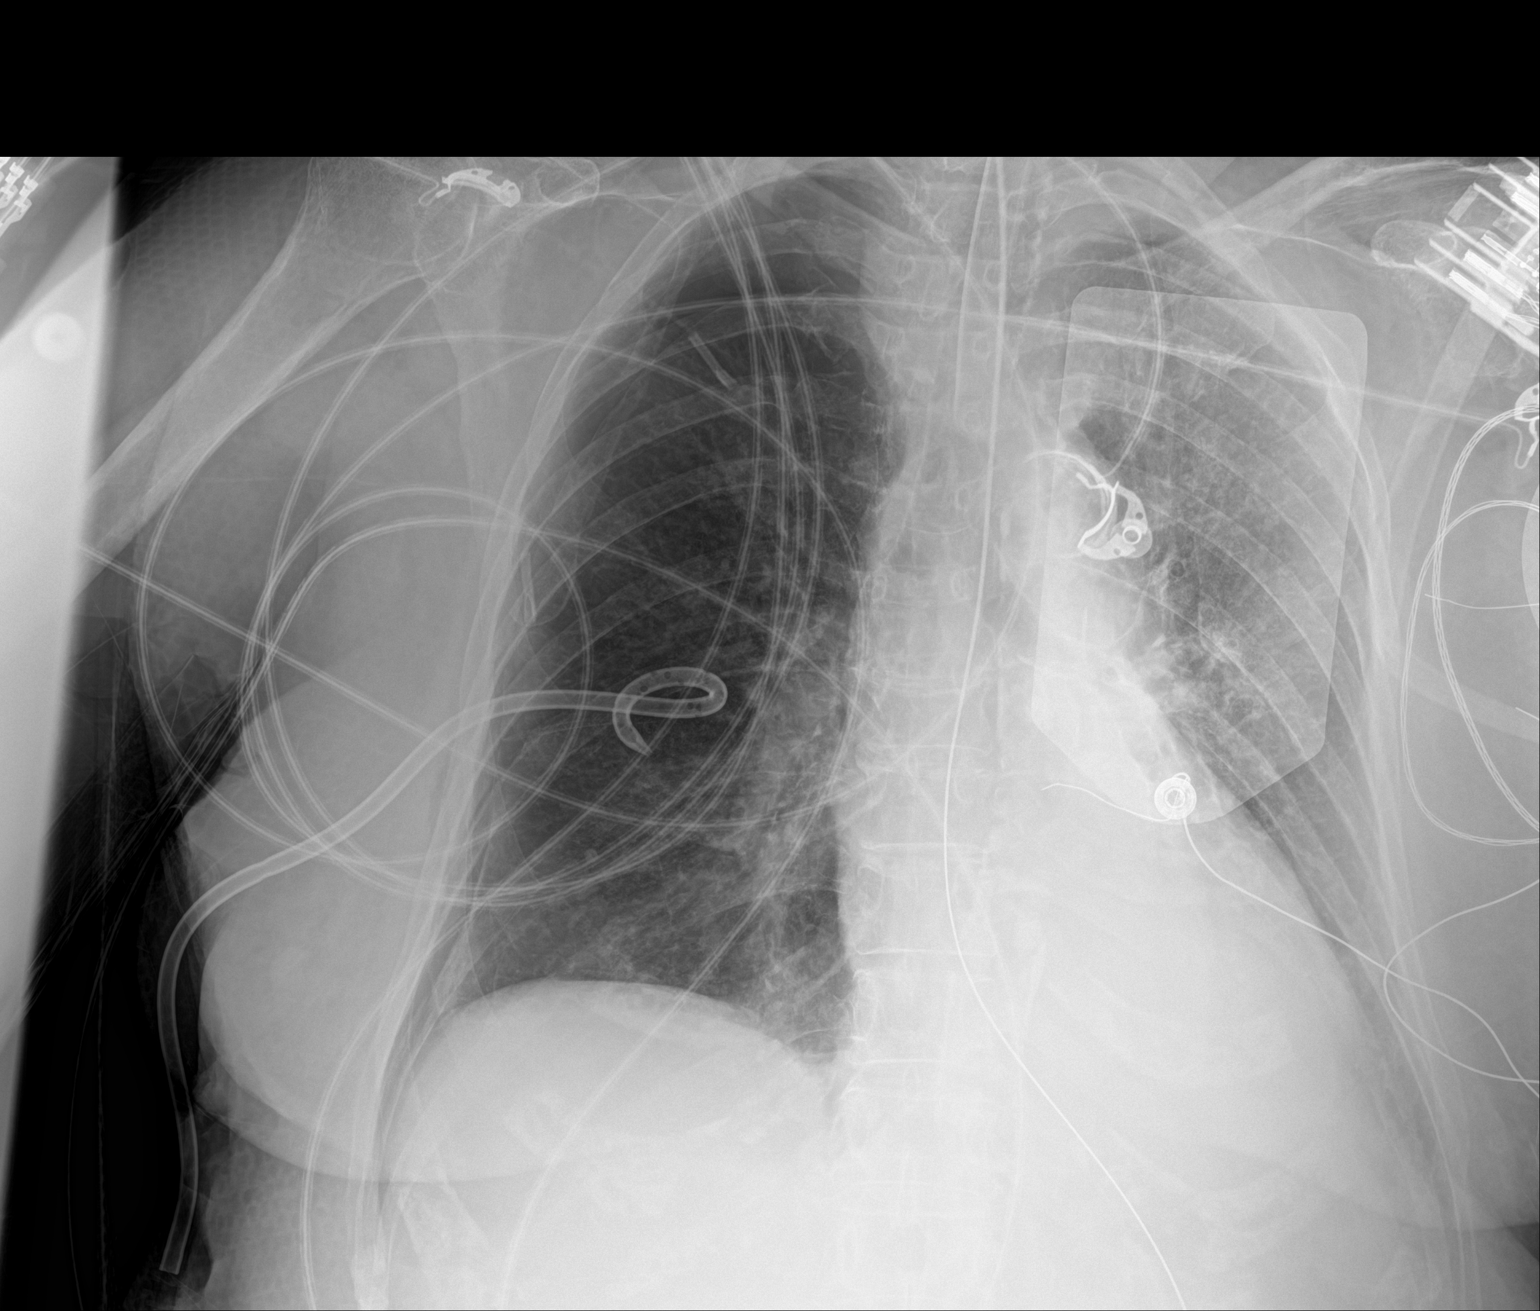

[1 of 1 positions shown; findings below may reference images not displayed]

FINDINGS: Portable AP semi upright view at 4949 hours. Stable pigtail right
chest tube. Residual right pneumothorax is increased from yesterday,
now moderate and with pleural edge visible at the costophrenic
angle.

There does also appear to be mild leftward shift of the mediastinum,
but also reduced left lung volume and dense left lower lobe opacity
now.

Stable endotracheal tube tip in good position between the clavicles
and carina. Stable visible enteric tube. Mediastinal contours remain
normal.
IMPRESSION: 1. Stable chest tube but progressed right pneumothorax since
yesterday, now moderate.
2. Worsening left lower lobe collapse or consolidation suspected.
3. Stable and satisfactory ET tube, enteric tube.

ADDENDUM:
Study discussed by telephone with Dr. KHADRA DOS on 06/15/2020 at
3919 hours.

*** End of Addendum ***
FINDINGS: Portable AP semi upright view at 4949 hours. Stable pigtail right
chest tube. Residual right pneumothorax is increased from yesterday,
now moderate and with pleural edge visible at the costophrenic
angle.

There does also appear to be mild leftward shift of the mediastinum,
but also reduced left lung volume and dense left lower lobe opacity
now.

Stable endotracheal tube tip in good position between the clavicles
and carina. Stable visible enteric tube. Mediastinal contours remain
normal.
IMPRESSION: 1. Stable chest tube but progressed right pneumothorax since
yesterday, now moderate.
2. Worsening left lower lobe collapse or consolidation suspected.
3. Stable and satisfactory ET tube, enteric tube.
